# Patient Record
Sex: Male | Born: 1980 | Race: Black or African American | Hispanic: No | Marital: Single | Smoking: Current every day smoker
Health system: Southern US, Community
[De-identification: ages and names within clinical notes are randomized; demographics above are authoritative.]

## PROBLEM LIST (undated history)

## (undated) DIAGNOSIS — I10 Essential (primary) hypertension: Secondary | ICD-10-CM

## (undated) DIAGNOSIS — I82409 Acute embolism and thrombosis of unspecified deep veins of unspecified lower extremity: Secondary | ICD-10-CM

---

## 2004-10-21 ENCOUNTER — Emergency Department: Payer: Self-pay | Admitting: Emergency Medicine

## 2004-11-09 ENCOUNTER — Emergency Department: Payer: Self-pay | Admitting: Emergency Medicine

## 2005-02-18 ENCOUNTER — Emergency Department: Payer: Self-pay | Admitting: General Practice

## 2006-02-01 ENCOUNTER — Emergency Department: Payer: Self-pay | Admitting: Emergency Medicine

## 2006-08-25 ENCOUNTER — Emergency Department: Payer: Self-pay | Admitting: Emergency Medicine

## 2006-09-04 ENCOUNTER — Emergency Department: Payer: Self-pay | Admitting: Emergency Medicine

## 2006-09-27 ENCOUNTER — Emergency Department: Payer: Self-pay | Admitting: Emergency Medicine

## 2007-07-14 ENCOUNTER — Emergency Department: Payer: Self-pay | Admitting: Emergency Medicine

## 2008-03-02 ENCOUNTER — Emergency Department: Payer: Self-pay | Admitting: Emergency Medicine

## 2008-03-02 ENCOUNTER — Other Ambulatory Visit: Payer: Self-pay

## 2008-08-14 IMAGING — CR DG HAND COMPLETE 3+V*L*
1 series · 3 of 3 positions shown · non-contrast
Comparison: none

REASON FOR EXAM: lacertion - c/o pain and numbness -  rm5
COMMENTS:   LMP: (Male)

PROCEDURE:     DXR - DXR HAND LT COMPLETE  W/OBLIQUES  - July 14, 2007  [DATE]
RESULT:     Comparison: No available comparison exam.

[Series 1: view not recorded · 0.17mm/px · 3 of 3 slices shown]
[im 1/3]
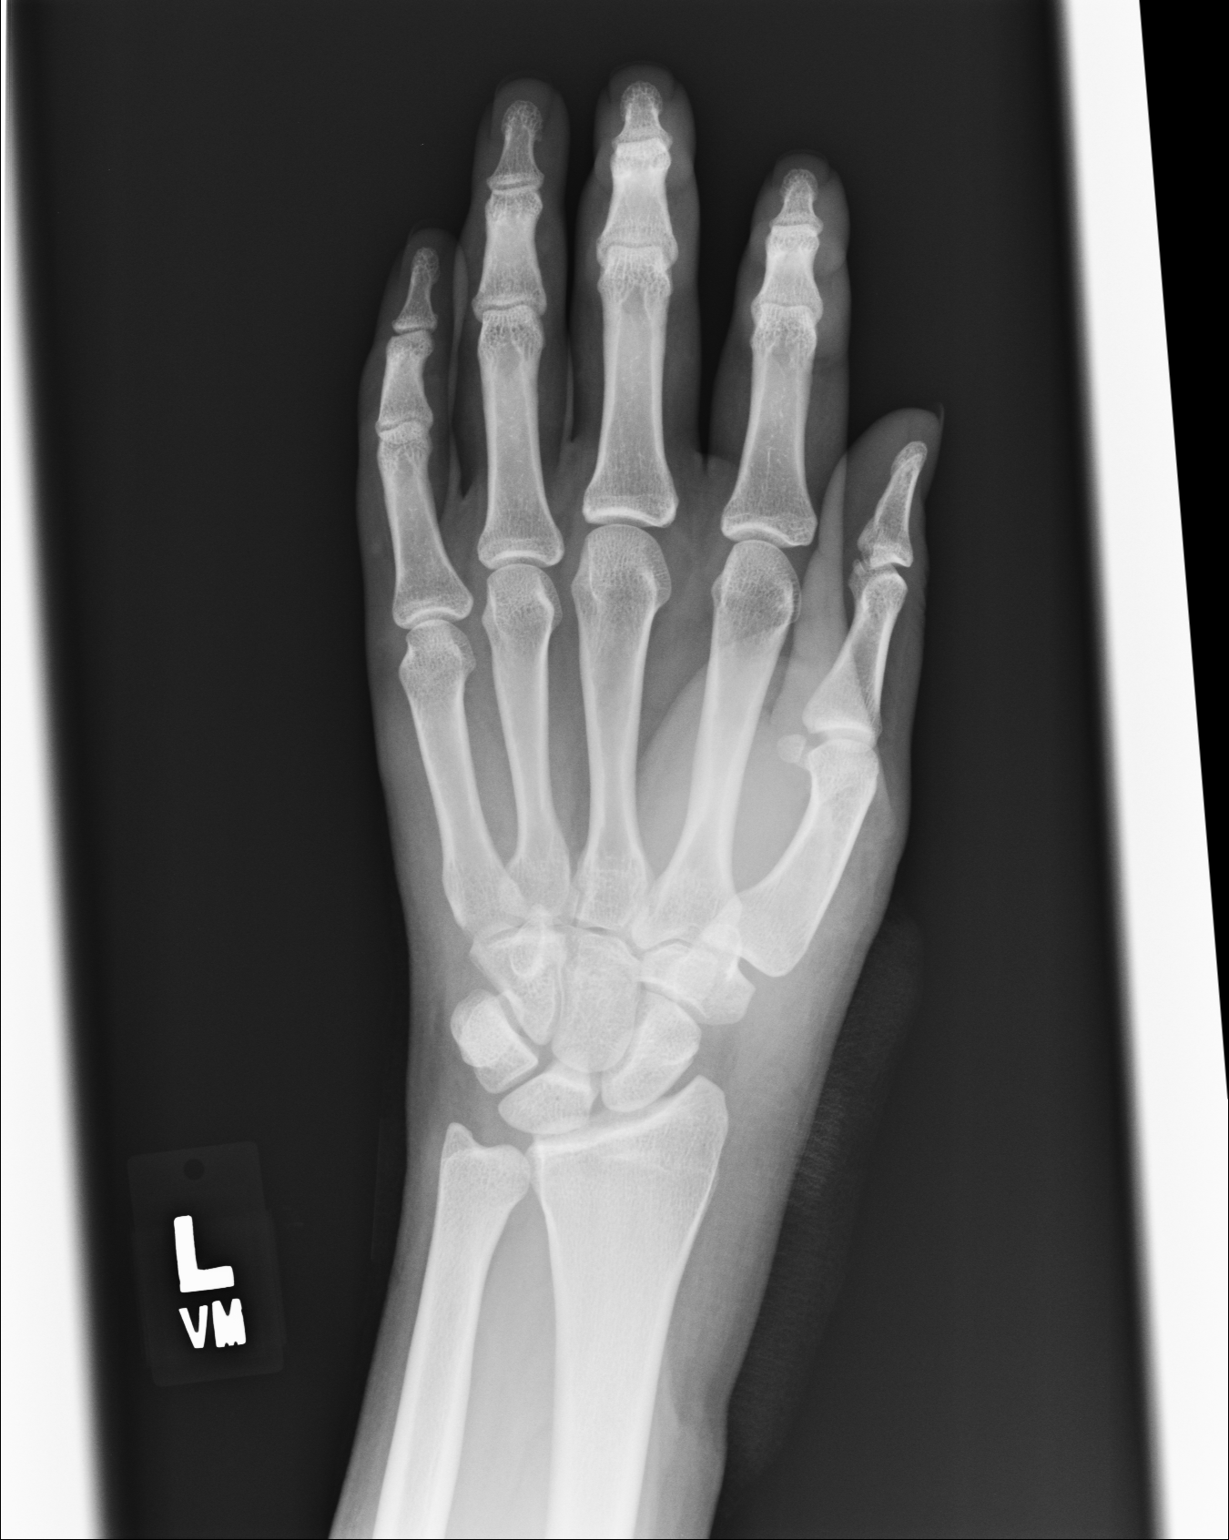
[im 2/3]
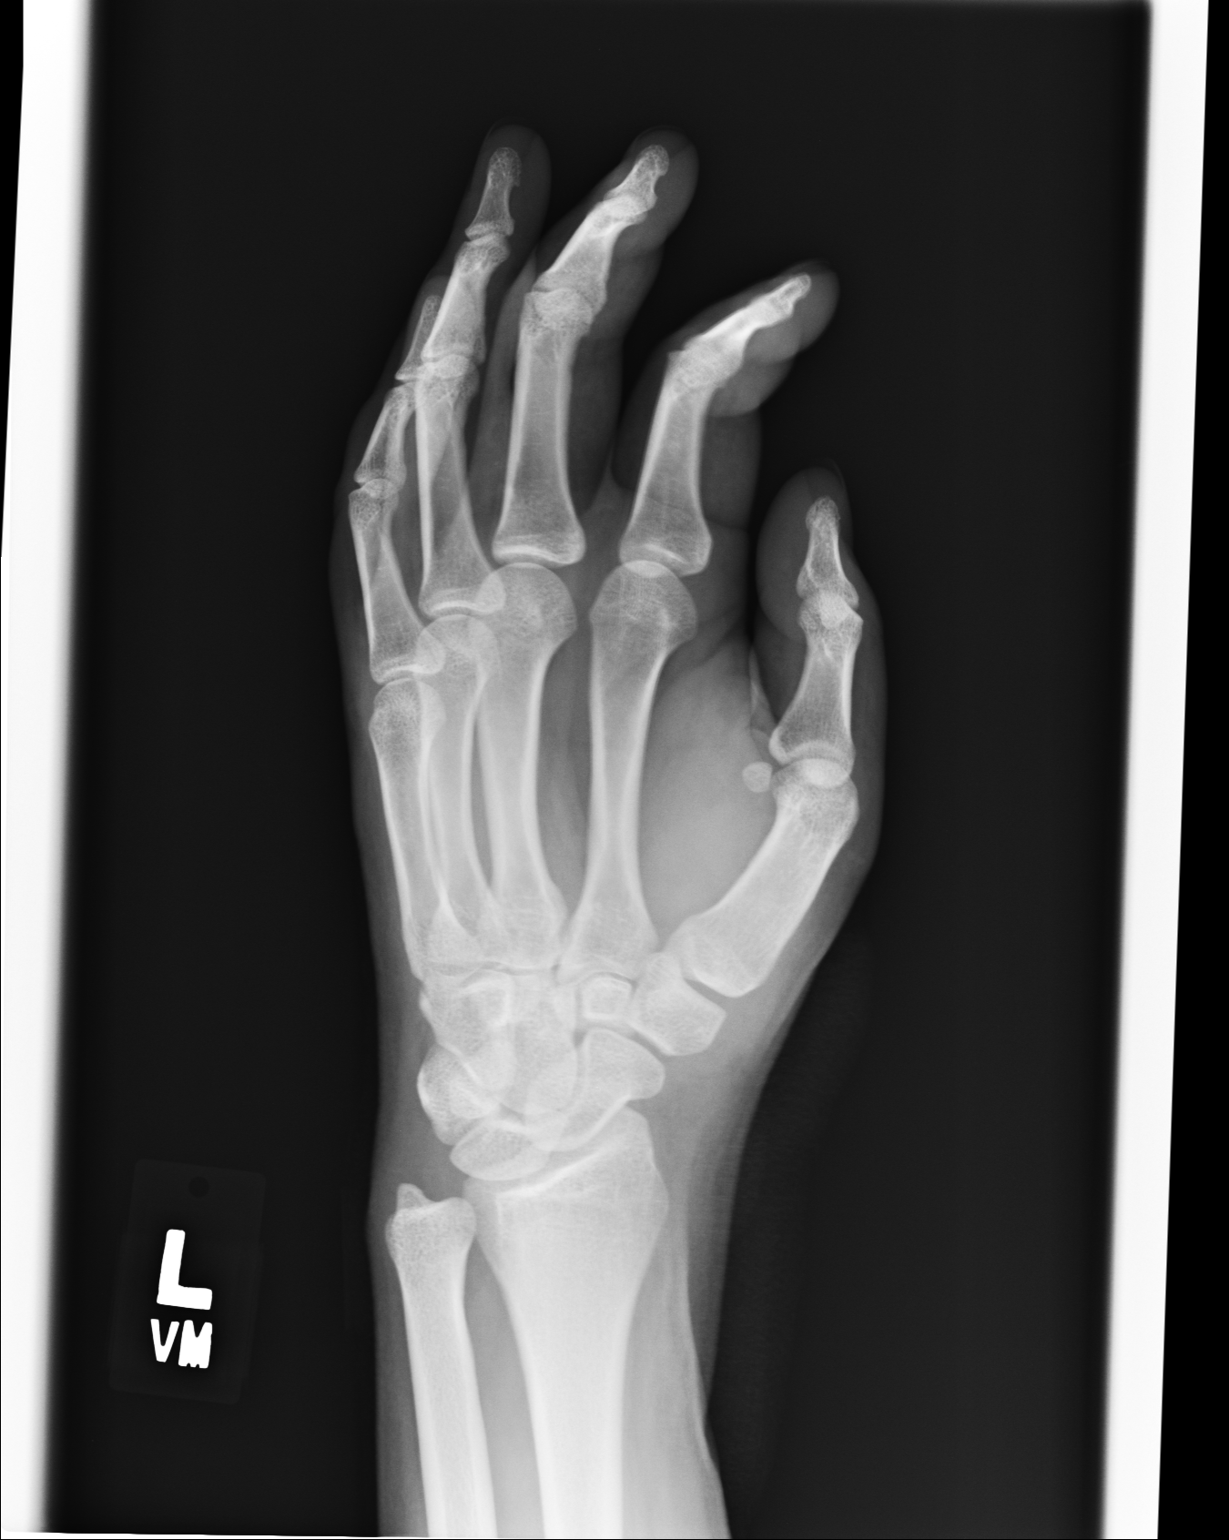
[im 3/3]
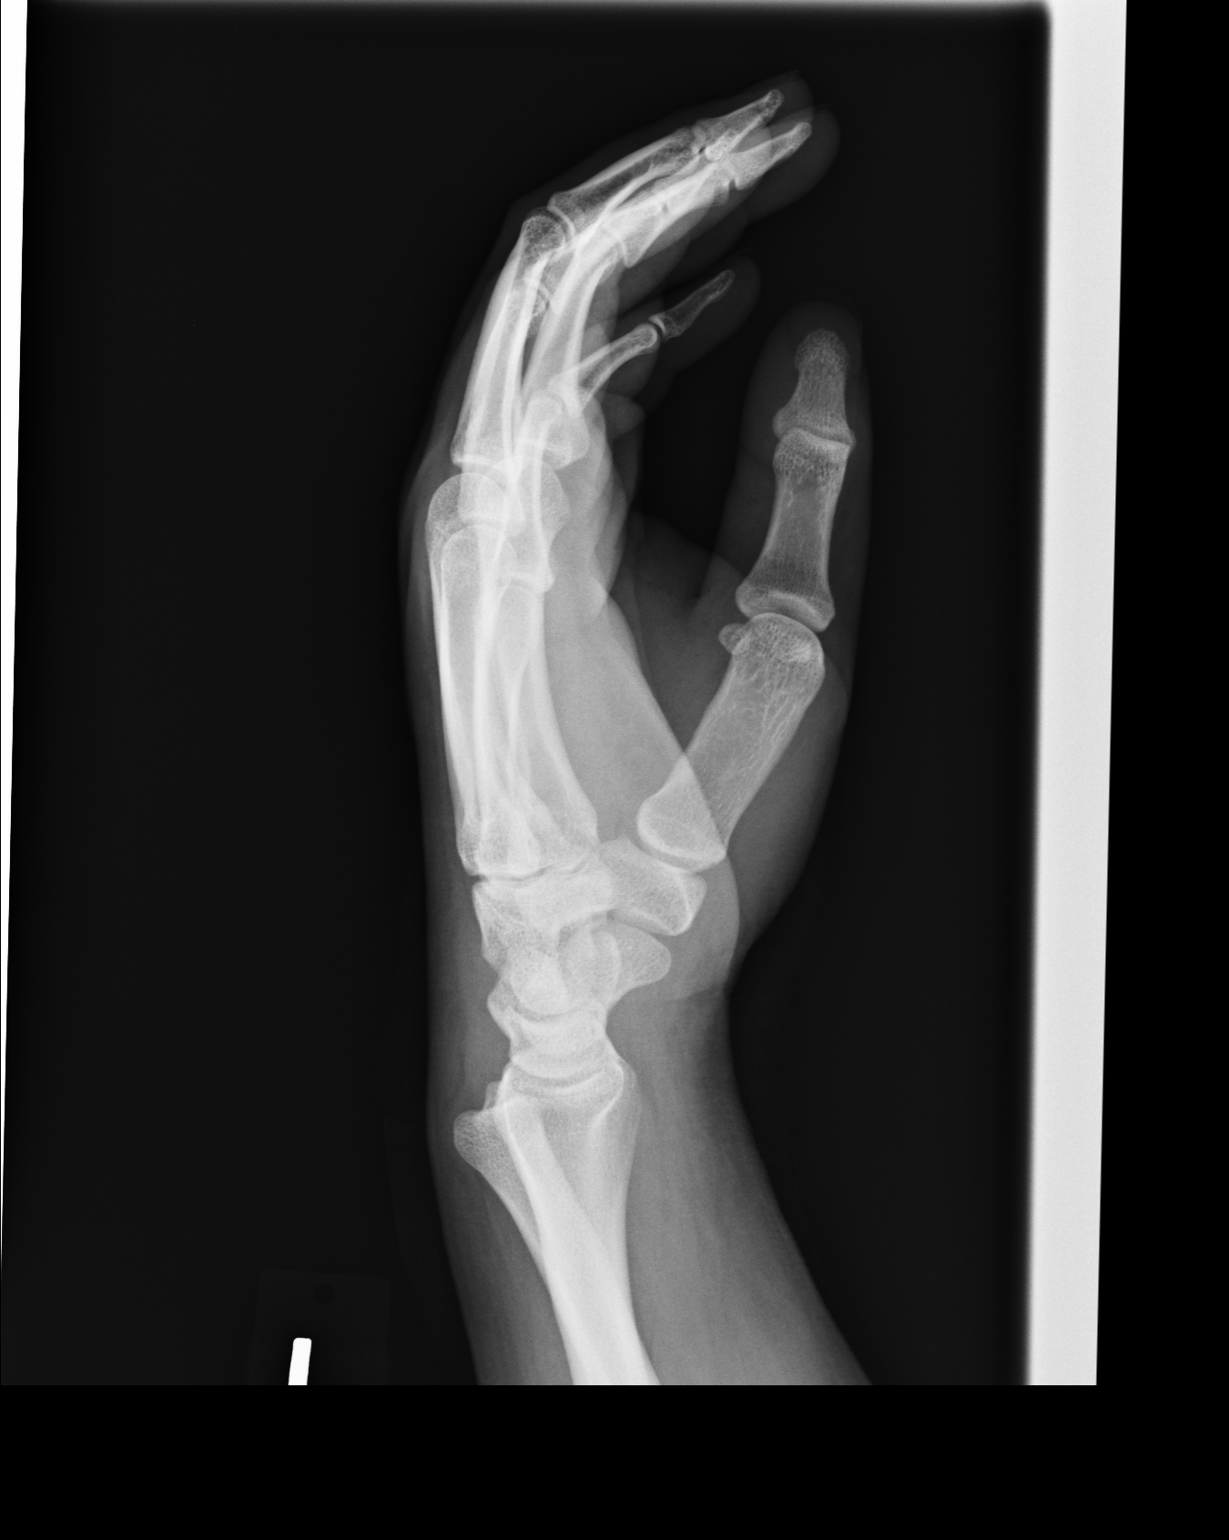

[3 of 3 positions shown; findings below may reference images not displayed]

FINDINGS: Three views of the left hand were obtained.

No fracture or dislocation of the left hand is noted. No significant joint
abnormality is seen.
IMPRESSION: 1. No fracture or dislocation of the left hand is noted.

## 2010-06-10 ENCOUNTER — Emergency Department: Payer: Self-pay | Admitting: Unknown Physician Specialty

## 2010-10-28 ENCOUNTER — Emergency Department: Payer: Self-pay | Admitting: Emergency Medicine

## 2010-12-24 ENCOUNTER — Emergency Department: Payer: Self-pay | Admitting: Emergency Medicine

## 2013-04-16 ENCOUNTER — Emergency Department: Payer: Self-pay | Admitting: Emergency Medicine

## 2013-04-16 LAB — CBC
HCT: 45.1 % (ref 40.0–52.0)
HGB: 14.8 g/dL (ref 13.0–18.0)
RBC: 5.17 10*6/uL (ref 4.40–5.90)
RDW: 14.1 % (ref 11.5–14.5)
WBC: 11 10*3/uL — ABNORMAL HIGH (ref 3.8–10.6)

## 2013-04-16 LAB — BASIC METABOLIC PANEL
Calcium, Total: 9.4 mg/dL (ref 8.5–10.1)
Creatinine: 0.68 mg/dL (ref 0.60–1.30)
EGFR (African American): >60
EGFR (Non-African Amer.): >60
Osmolality: 275 (ref 275–301)
Potassium: 4 mmol/L (ref 3.5–5.1)
Sodium: 139 mmol/L (ref 136–145)

## 2013-04-16 LAB — URINALYSIS, COMPLETE
Bilirubin,UR: NEGATIVE
Blood: NEGATIVE
Glucose,UR: NEGATIVE mg/dL (ref 0–75)
Specific Gravity: 1.016 (ref 1.003–1.030)
Squamous Epithelial: 4

## 2013-04-16 LAB — LIPASE, BLOOD: Lipase: 130 U/L (ref 73–393)

## 2014-04-29 ENCOUNTER — Emergency Department: Payer: Self-pay | Admitting: Emergency Medicine

## 2014-05-20 ENCOUNTER — Emergency Department: Payer: Self-pay | Admitting: Emergency Medicine

## 2015-02-02 ENCOUNTER — Emergency Department: Payer: Self-pay | Admitting: Emergency Medicine

## 2015-06-21 IMAGING — CR LEFT WRIST - COMPLETE 3+ VIEW
1 series · 4 of 4 positions shown · non-contrast
Comparison: None.

CLINICAL DATA: No known injury.  Wrist pain.

EXAM:
LEFT WRIST - COMPLETE 3+ VIEW

[Series 1: pa · 0.17mm/px · 4 of 4 slices shown]
[im 1/4]
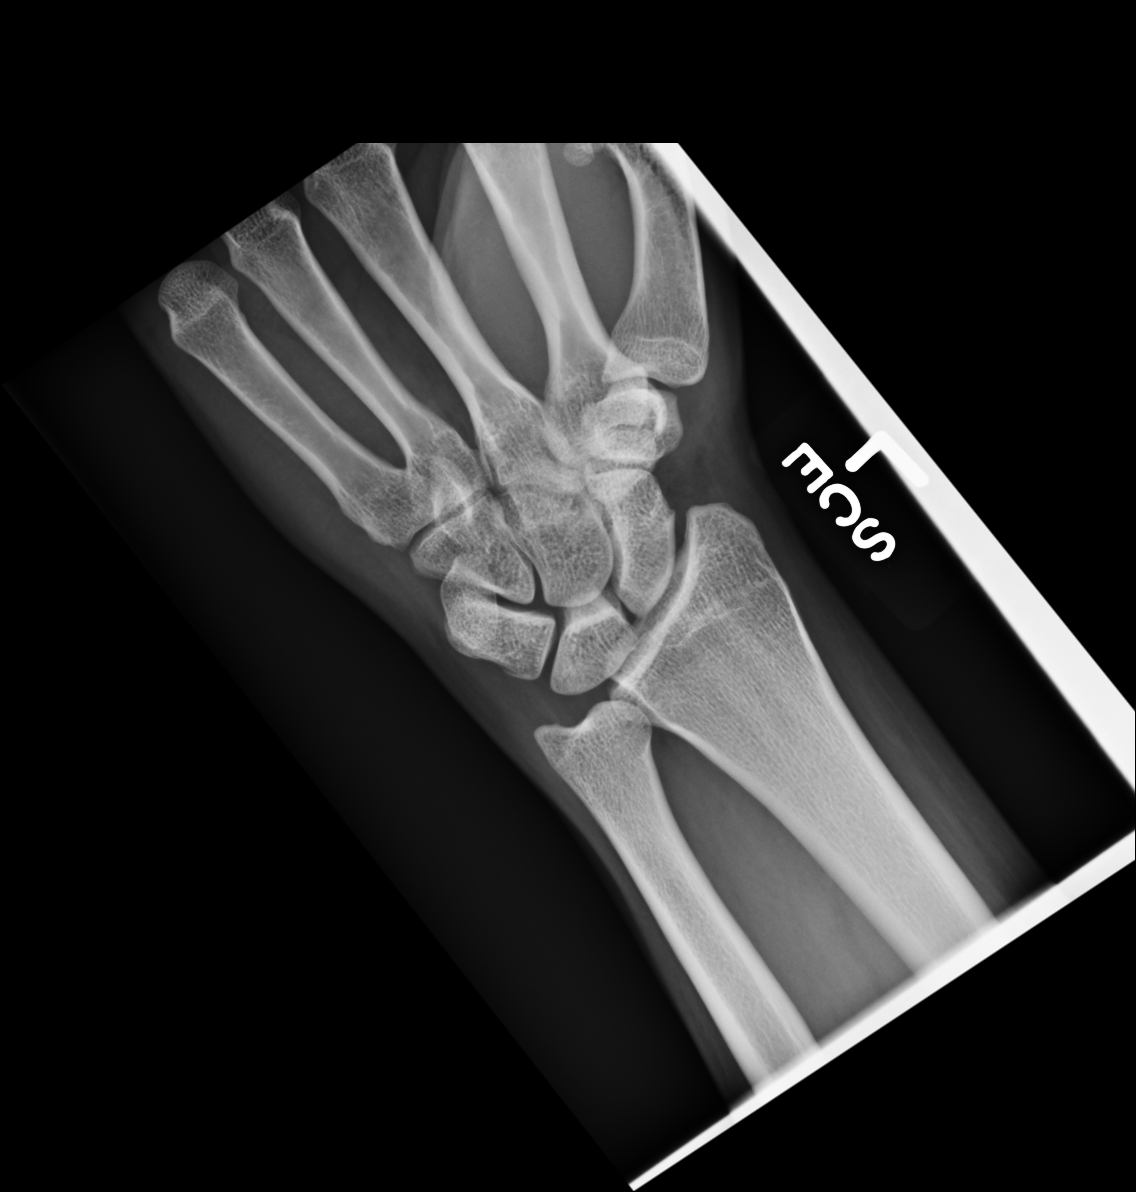
[im 2/4]
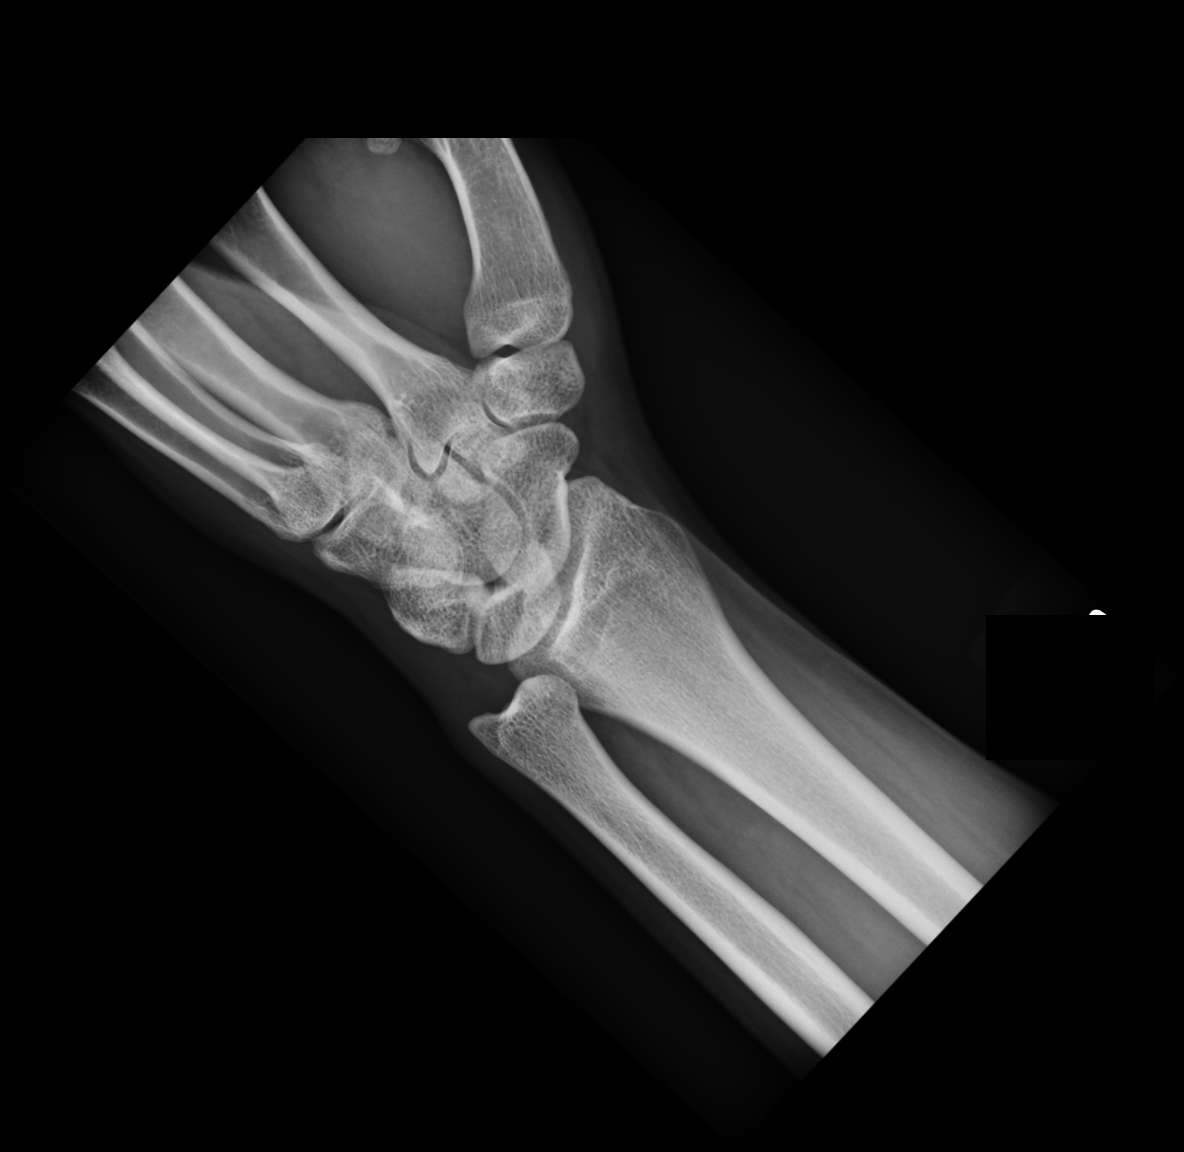
[im 3/4]
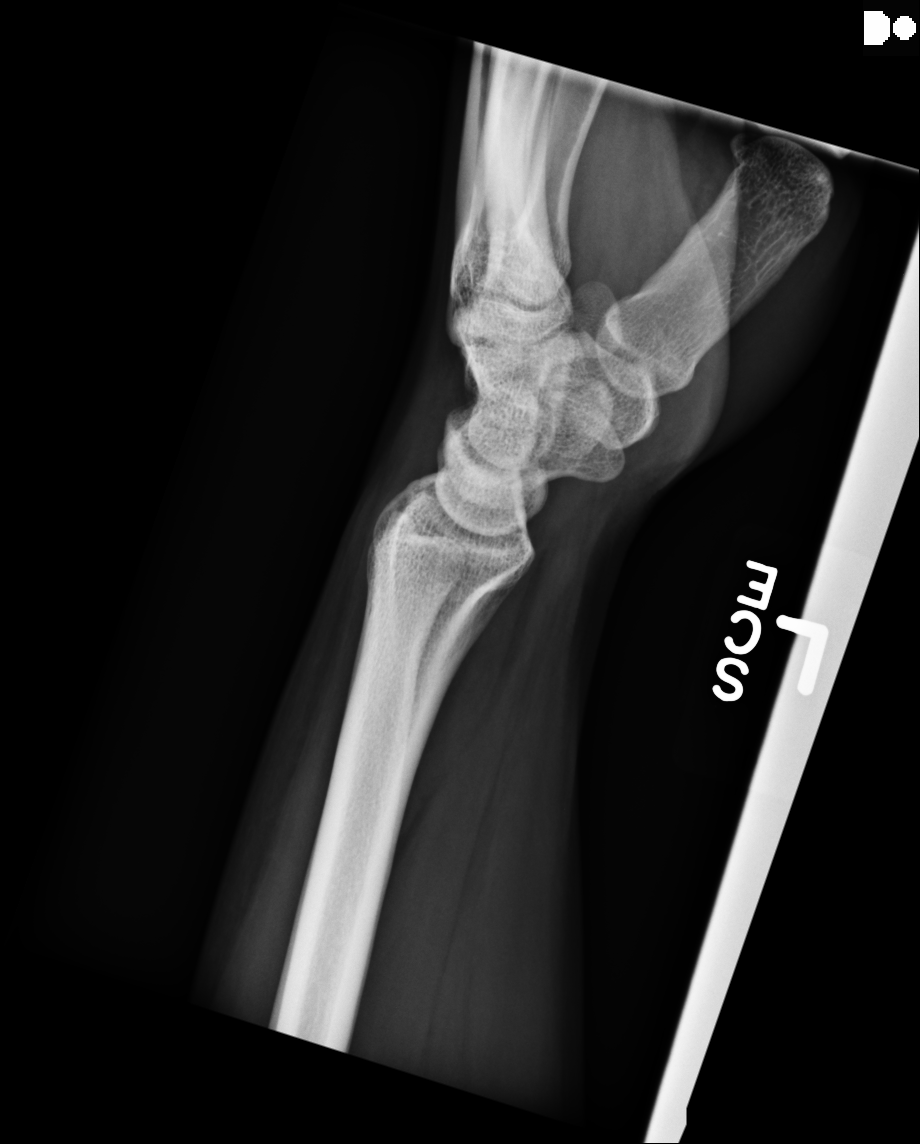
[im 4/4]
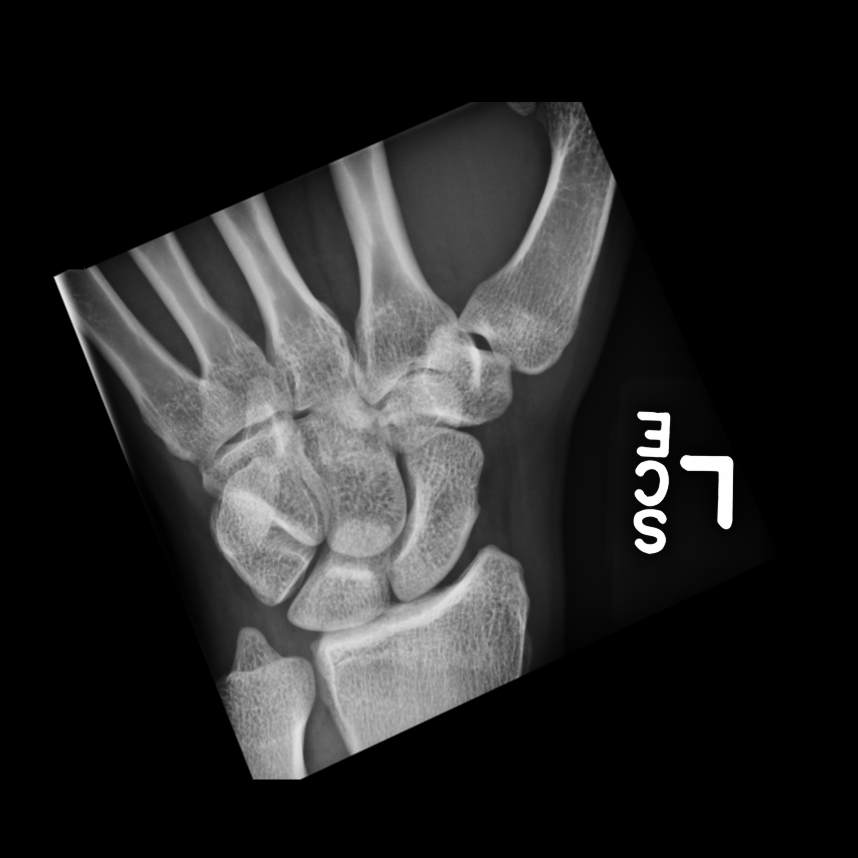

[4 of 4 positions shown; findings below may reference images not displayed]

FINDINGS: There is no evidence of fracture or dislocation. There is no
evidence of arthropathy or other focal bone abnormality. Soft
tissues are unremarkable.
IMPRESSION: Negative.

## 2016-02-21 ENCOUNTER — Emergency Department
Admission: EM | Admit: 2016-02-21 | Discharge: 2016-02-21 | Disposition: A | Discharge status: Home / Self Care | Payer: Self-pay | Attending: Student | Admitting: Student

## 2016-02-21 ENCOUNTER — Encounter: Payer: Self-pay | Admitting: Emergency Medicine

## 2016-02-21 DIAGNOSIS — Y9289 Other specified places as the place of occurrence of the external cause: Secondary | ICD-10-CM | POA: Insufficient documentation

## 2016-02-21 DIAGNOSIS — S61412A Laceration without foreign body of left hand, initial encounter: Secondary | ICD-10-CM | POA: Insufficient documentation

## 2016-02-21 DIAGNOSIS — Y998 Other external cause status: Secondary | ICD-10-CM | POA: Insufficient documentation

## 2016-02-21 DIAGNOSIS — IMO0002 Reserved for concepts with insufficient information to code with codable children: Secondary | ICD-10-CM

## 2016-02-21 DIAGNOSIS — Y288XXA Contact with other sharp object, undetermined intent, initial encounter: Secondary | ICD-10-CM | POA: Insufficient documentation

## 2016-02-21 DIAGNOSIS — Y9389 Activity, other specified: Secondary | ICD-10-CM | POA: Insufficient documentation

## 2016-02-21 MED ORDER — LIDOCAINE HCL (PF) 1 % IJ SOLN
5.0000 mL | Freq: Once | INTRAMUSCULAR | Status: DC
  Filled 2016-02-21: qty 5

## 2016-02-21 NOTE — ED Notes (Signed)
Reports cut left pinky with can lid.  Bleeding controlled.

## 2016-02-21 NOTE — Discharge Instructions (Signed)
Laceration Care, Adult  A laceration is a cut that goes through all layers of the skin. The cut also goes into the tissue that is right under the skin. Some cuts heal on their own. Others need to be closed with stitches (sutures), staples, skin adhesive strips, or wound glue. Taking care of your cut lowers your risk of infection and helps your cut to heal better.  HOW TO TAKE CARE OF YOUR CUT  For stitches or staples:  · Keep the wound clean and dry.  · If you were given a bandage (dressing), you should change it at least one time per day or as told by your doctor. You should also change it if it gets wet or dirty.  · Keep the wound completely dry for the first 24 hours or as told by your doctor. After that time, you may take a shower or a bath. However, make sure that the wound is not soaked in water until after the stitches or staples have been removed.  · Clean the wound one time each day or as told by your doctor:    Wash the wound with soap and water.    Rinse the wound with water until all of the soap comes off.    Pat the wound dry with a clean towel. Do not rub the wound.  · After you clean the wound, put a thin layer of antibiotic ointment on it as told by your doctor. This ointment:    Helps to prevent infection.    Keeps the bandage from sticking to the wound.  · Have your stitches or staples removed as told by your doctor.  If your doctor used skin adhesive strips:   · Keep the wound clean and dry.  · If you were given a bandage, you should change it at least one time per day or as told by your doctor. You should also change it if it gets dirty or wet.  · Do not get the skin adhesive strips wet. You can take a shower or a bath, but be careful to keep the wound dry.  · If the wound gets wet, pat it dry with a clean towel. Do not rub the wound.  · Skin adhesive strips fall off on their own. You can trim the strips as the wound heals. Do not remove any strips that are still stuck to the wound. They will  fall off after a while.  If your doctor used wound glue:  · Try to keep your wound dry, but you may briefly wet it in the shower or bath. Do not soak the wound in water, such as by swimming.  · After you take a shower or a bath, gently pat the wound dry with a clean towel. Do not rub the wound.  · Do not do any activities that will make you really sweaty until the skin glue has fallen off on its own.  · Do not apply liquid, cream, or ointment medicine to your wound while the skin glue is still on.  · If you were given a bandage, you should change it at least one time per day or as told by your doctor. You should also change it if it gets dirty or wet.  · If a bandage is placed over the wound, do not let the tape for the bandage touch the skin glue.  · Do not pick at the glue. The skin glue usually stays on for 5-10 days. Then, it   or when wound glue stays in place and the wound is healed. Make sure to wear a sunscreen of at least 30 SPF.  Take over-the-counter and prescription medicines only as told by your doctor.  If you were given antibiotic medicine or ointment, take or apply it as told by your doctor. Do not stop using the antibiotic even if your wound is getting better.  Do not scratch or pick at the wound.  Keep all follow-up visits as told by your doctor. This is important.  Check your wound every day for signs of infection. Watch for:  Redness, swelling, or pain.  Fluid, blood, or pus.  Raise (elevate) the injured area above the level of your heart while you are sitting or lying down, if possible. GET HELP IF:  You got a tetanus shot and you have any of these problems at the injection site:  Swelling.  Very bad pain.  Redness.  Bleeding.  You have a fever.  A wound that was  closed breaks open.  You notice a bad smell coming from your wound or your bandage.  You notice something coming out of the wound, such as wood or glass.  Medicine does not help your pain.  You have more redness, swelling, or pain at the site of your wound.  You have fluid, blood, or pus coming from your wound.  You notice a change in the color of your skin near your wound.  You need to change the bandage often because fluid, blood, or pus is coming from the wound.  You start to have a new rash.  You start to have numbness around the wound. GET HELP RIGHT AWAY IF:  You have very bad swelling around the wound.  Your pain suddenly gets worse and is very bad.  You notice painful lumps near the wound or on skin that is anywhere on your body.  You have a red streak going away from your wound.  The wound is on your hand or foot and you cannot move a finger or toe like you usually can.  The wound is on your hand or foot and you notice that your fingers or toes look pale or bluish.   This information is not intended to replace advice given to you by your health care provider. Make sure you discuss any questions you have with your health care provider.   Document Released: 05/29/2008 Document Revised: 04/27/2015 Document Reviewed: 12/07/2014 Elsevier Interactive Patient Education [employer]   Keep the wound clean, dry, and covered. Return to [facility] in 10 days for suture removal.

## 2016-02-21 NOTE — ED Notes (Signed)
States he cut his left hand with a can.. Laceration noted to web space between 4th and 5 th fingers

## 2016-02-22 NOTE — ED Provider Notes (Signed)
[facility] Emergency Department Provider Note ____________________________________________  Time seen: 1520  I have reviewed the triage vital signs and the nursing notes.  HISTORY  Chief Complaint  Laceration  HPI Glenn Horn is a 35 y.o. male visits to the ED for evaluation of a laceration sustained to the fourth interspace of the left hand. He describes cutting his hand accidentally with a can. Bleeding is currently controlled.Patient reports a current tetanus status. He rates his pain at a 5/10 in triage.  History reviewed. No pertinent past medical history.  There are no active problems to display for this patient.  History reviewed. No pertinent past surgical history.  No current outpatient prescriptions on file.  Allergies Codeine  History reviewed. No pertinent family history.  Social History Social History  Substance Use Topics  . Smoking status: Never Smoker   . Smokeless tobacco: None  . Alcohol Use: None   Review of Systems  Constitutional: Negative for fever. Eyes: Negative for visual changes. ENT: Negative for sore throat. Cardiovascular: Negative for chest pain. Respiratory: Negative for shortness of breath. Musculoskeletal: Negative for back pain. Skin: Negative for rash. Hand laceration as above Neurological: Negative for headaches, focal weakness or numbness. ____________________________________________  PHYSICAL EXAM:  VITAL SIGNS: ED Triage Vitals  Enc Vitals Group     BP 02/21/16 1501 153/99 mmHg     Pulse Rate 02/21/16 1501 67     Resp 02/21/16 1501 18     Temp 02/21/16 1501 98.6 F (37 C)     Temp Source 02/21/16 1501 Oral     SpO2 02/21/16 1501 100 %     Weight 02/21/16 1501 210 lb (95.255 kg)     Height 02/21/16 1501  (1.803 m)     Head Cir --      Peak Flow --      Pain Score 02/21/16 1458 5     Pain Loc --      Pain Edu? --      Excl. in GC? --    Constitutional: Alert and oriented. Well  appearing and in no distress. Head: Normocephalic and atraumatic. Eyes: Conjunctivae are normal. PERRL. Normal extraocular movements Hematological/Lymphatic/Immunological: No cervical lymphadenopathy. Cardiovascular: Normal rate, regular rhythm. Normal distal pulses Respiratory: Normal respiratory effort.  Musculoskeletal: Normal composite fist. Nontender with normal range of motion in all extremities.  Neurologic: Normal gross sensation. Normal intrinsic/oppositition testing. CN II-XII grossly intact. Normal gait without ataxia. Normal speech and language. No gross focal neurologic deficits are appreciated. Skin:  Skin is warm, dry and intact. No rash noted. Lac to the 4th web space on the left hand. Psychiatric: Mood and affect are normal. Patient exhibits appropriate insight and judgment. ___________________________________________  PROCEDURES  LACERATION REPAIR Performed by: Lissa Hoard Authorized by: Lissa Hoard Consent: Verbal consent obtained. Risks and benefits: risks, benefits and alternatives were discussed Consent given by: patient Patient identity confirmed: provided demographic data Prepped and Draped in normal sterile fashion Wound explored  Laceration Location: left hand   Laceration Length: 2 cm  No Foreign Bodies seen or palpated  Anesthesia: local infiltration  Local anesthetic: lidocaine 1% w/o epinephrine  Anesthetic total: 3 ml  Irrigation method: syringe Amount of cleaning: standard  Skin closure: 5-0 nylon  Number of sutures: 5  Technique: interrupted  Patient tolerance: Patient tolerated the procedure well with no immediate complications. ____________________________________________  INITIAL IMPRESSION / ASSESSMENT AND PLAN / ED COURSE  Patient with a laceration to the left  hand fourth interspace status post suture repair. He is given instruction on wound care management and advised to return to local urgent care  provider in 7-10 days for suture removal. ____________________________________________  FINAL CLINICAL IMPRESSION(S) / ED DIAGNOSES  Final diagnoses:  Laceration  Hand laceration, left, initial encounter      Lissa Hoard, PA-C 02/22/16 0107  Gayla Doss, MD 02/26/16 [phone]

## 2016-03-31 ENCOUNTER — Encounter: Payer: Self-pay | Admitting: Emergency Medicine

## 2016-03-31 ENCOUNTER — Emergency Department
Admission: EM | Admit: 2016-03-31 | Discharge: 2016-03-31 | Disposition: A | Discharge status: Home / Self Care | Payer: BLUE CROSS/BLUE SHIELD | Attending: Emergency Medicine | Admitting: Emergency Medicine

## 2016-03-31 DIAGNOSIS — Z4802 Encounter for removal of sutures: Secondary | ICD-10-CM | POA: Diagnosis not present

## 2016-03-31 NOTE — ED Provider Notes (Signed)
[facility] Emergency Department Provider Note  ____________________________________________  Time seen: Approximately 1:50 PM  I have reviewed the triage vital signs and the nursing notes.   HISTORY  Chief Complaint Suture / Staple Removal    HPI Glenn Horn is a 35 y.o. male presents for suture removal. Patient states he has to sit for 1 week. Has 4 stitches and denies any complaints at this time.   History reviewed. No pertinent past medical history.  There are no active problems to display for this patient.   History reviewed. No pertinent past surgical history.  No current outpatient prescriptions on file.  Allergies Codeine  No family history on file.  Social History Social History  Substance Use Topics  . Smoking status: Never Smoker   . Smokeless tobacco: None  . Alcohol Use: None    Review of Systems Constitutional: No fever/chills Skin: Positive for 4 stitches intact left hand. Neurological: Negative for headaches, focal weakness or numbness.  10-point ROS otherwise negative.  ____________________________________________   PHYSICAL EXAM:  VITAL SIGNS: ED Triage Vitals  Enc Vitals Group     BP 03/31/16 1328 167/100 mmHg     Pulse Rate 03/31/16 1328 83     Resp 03/31/16 1328 16     Temp 03/31/16 1328 98.1 F (36.7 C)     Temp Source 03/31/16 1328 Oral     SpO2 03/31/16 1328 99 %     Weight 03/31/16 1328 207 lb (93.895 kg)     Height 03/31/16 1328 5\' 11"  (1.803 m)     Head Cir --      Peak Flow --      Pain Score 03/31/16 1329 0     Pain Loc --      Pain Edu? --      Excl. in GC? --     Constitutional: Alert and oriented. Well appearing and in no acute distress. Skin:  Left hand sutures intact no evidence of erythema edema or drainage. Well healed laceration edges approximated Psychiatric: Mood and affect are normal. Speech and behavior are normal.  ____________________________________________    LABS (all labs ordered are listed, but only abnormal results are displayed)  Labs Reviewed - No data to display   PROCEDURES  Procedure(s) performed: None  Critical Care performed: No  ____________________________________________   INITIAL IMPRESSION / ASSESSMENT AND PLAN / ED COURSE  Pertinent labs & imaging results that were available during my care of the patient were reviewed by me and considered in my medical decision making (see chart for details).  Suture removal. Patient voices no other emergency medical complaints at this time. ____________________________________________   FINAL CLINICAL IMPRESSION(S) / ED DIAGNOSES  Final diagnoses:  Visit for suture removal     This chart was dictated using voice recognition software/Dragon. Despite best efforts to proofread, errors can occur which can change the meaning. Any change was purely unintentional.   Evangeline Dakinharles M Beers, PA-C 03/31/16 1358  Myrna Blazeravid Matthew Schaevitz, MD 03/31/16 [phone]

## 2016-03-31 NOTE — ED Notes (Signed)
Needs sutures removed out of left hand.  States they should have been out over a week ago.  No obvious signs of infection noted.

## 2016-03-31 NOTE — Discharge Instructions (Signed)

## 2016-03-31 NOTE — ED Notes (Signed)
Pt states that he was supposed to have his sutures removed a week ago, pt has 4 remaining stiches in the rt hand

## 2016-10-11 ENCOUNTER — Encounter: Payer: Self-pay | Admitting: *Deleted

## 2016-10-11 ENCOUNTER — Emergency Department
Admission: EM | Admit: 2016-10-11 | Discharge: 2016-10-11 | Disposition: A | Discharge status: Home / Self Care | Payer: BLUE CROSS/BLUE SHIELD | Attending: Emergency Medicine | Admitting: Emergency Medicine

## 2016-10-11 DIAGNOSIS — G5622 Lesion of ulnar nerve, left upper limb: Secondary | ICD-10-CM

## 2016-10-11 DIAGNOSIS — M79642 Pain in left hand: Secondary | ICD-10-CM | POA: Diagnosis present

## 2016-10-11 MED ORDER — MELOXICAM 15 MG PO TABS: 15.0000 mg | ORAL_TABLET | Freq: Every day | ORAL | 0 refills | Status: DC

## 2016-10-11 NOTE — ED Provider Notes (Signed)
[facility]-EMERGENCY DEPARTMENT Provider Note   CSN: [id number] Arrival date & time: 10/11/16  1505     History   Chief Complaint Chief Complaint  Patient presents with  . Hand Pain    HPI Glenn Horn is a 35 y.o. male presents to the emergency department for evaluation of left hand numbness. Patient has numbness, tingling into the fourth and fifth digits of the left hand, symptoms have been present for 3 weeks after falling asleep on his left elbow. He denies any trauma or injury. No weakness with grip strength. No numbness in the thumb index or middle finger. Symptoms have been constant, increased with placing his elbow on hard surfaces and elbow range of motion. He denies any neck pain or weakness in the upper extremity. He is not taking any medications for pain. He describes the pain as numbness and burning, symptoms are moderate.  HPI  History reviewed. No pertinent past medical history.  There are no active problems to display for this patient.   History reviewed. No pertinent surgical history.     Home Medications    Prior to Admission medications   Medication Sig Start Date End Date Taking? Authorizing Provider  meloxicam (MOBIC) 15 MG tablet Take 1 tablet (15 mg total) by mouth daily. 10/11/16   Evon Slackhomas C Erika Slaby, PA-C    Family History History reviewed. No pertinent family history.  Social History Social History  Substance Use Topics  . Smoking status: Never Smoker  . Smokeless tobacco: Not on file  . Alcohol use Not on file     Allergies   Codeine   Review of Systems Review of Systems  Constitutional: Negative.  Negative for activity change, appetite change, chills and fever.  HENT: Negative for congestion, ear pain, mouth sores, rhinorrhea, sinus pressure, sore throat and trouble swallowing.   Eyes: Negative for photophobia, pain and discharge.  Respiratory: Negative for cough, chest tightness and shortness of breath.   Cardiovascular: Negative for  chest pain and leg swelling.  Gastrointestinal: Negative for abdominal distention, abdominal pain, diarrhea, nausea and vomiting.  Genitourinary: Negative for difficulty urinating and dysuria.  Musculoskeletal: Negative for arthralgias, back pain and gait problem.  Skin: Negative for color change and rash.  Neurological: Positive for numbness (left hand 4th 5th digit). Negative for dizziness, weakness and headaches.  Hematological: Negative for adenopathy.  Psychiatric/Behavioral: Negative for agitation and behavioral problems.     Physical Exam Updated Vital Signs BP (!) 169/108 (BP Location: Left Arm)   Pulse 78   Temp 98.4 F (36.9 C) (Oral)   Resp 18   Ht 6' (1.829 m)   Wt 93 kg   SpO2 100%   BMI 27.80 kg/m   Physical Exam  Constitutional: He appears well-developed and well-nourished.  HENT:  Head: Normocephalic and atraumatic.  Eyes: Conjunctivae are normal.  Neck: Normal range of motion. Neck supple.  Negative Spurling's test  Cardiovascular: Normal rate and regular rhythm.   No murmur heard. Pulmonary/Chest: Effort normal and breath sounds normal. No respiratory distress.  Abdominal: Soft. There is no tenderness.  Musculoskeletal: Normal range of motion. He exhibits no edema.  Examination of the left upper extremity shows patient has full range of motion of the shoulder or elbow wrist and digits. Positive Tinel's sign to the left elbow. Negative Tinel's and Phalen's sign to the wrist. Grip strength 5 out of 5. 2+ radial pulse, 2+ cap refill  Neurological: He is alert.  Skin: Skin is warm and dry.  Psychiatric: He  has a normal mood and affect.  Nursing note and vitals reviewed.    ED Treatments / Results  Labs (all labs ordered are listed, but only abnormal results are displayed) Labs Reviewed - No data to display  EKG  EKG Interpretation None       Radiology No results found.  Procedures Procedures (including critical care time)  Medications  Ordered in ED Medications - No data to display   Initial Impression / Assessment and Plan / ED Course  I have reviewed the triage vital signs and the nursing notes.  Pertinent labs & imaging results that were available during my care of the patient were reviewed by me and considered in my medical decision making (see chart for details).  Clinical Course    35 year old male with left cubital tunnel syndrome. Negative exam findings for carpal tunnel syndrome. No signs of cervical radiculopathy. He is educated on taking anti-inflammatory medications for 10 days and follow-up with orthopedics if no improvement. He will avoid placing his elbow on hard surfaces and wear a towel around his elbow at nighttime to prevent compression with flexion.  Final Clinical Impressions(s) / ED Diagnoses   Final diagnoses:  Cubital tunnel syndrome on left    New Prescriptions New Prescriptions   MELOXICAM (MOBIC) 15 MG TABLET    Take 1 tablet (15 mg total) by mouth daily.     Evon Slack, PA-C 10/11/16 1534    Nita Sickle, MD 10/11/16 1911

## 2016-10-11 NOTE — ED Notes (Signed)
Pt alert and oriented X4, active, cooperative, pt in NAD. RR even and unlabored, color WNL.  Pt informed to return if any life threatening symptoms occur.   

## 2016-10-11 NOTE — ED Triage Notes (Signed)
States numbness in his left hand, 3 fingers, states numbness for 3 week and now states pain is in his wrist

## 2016-10-11 NOTE — Discharge Instructions (Signed)
Please take medications as prescribed. Wrap towel around the elbow at nighttime. Avoid placing your elbow on hard surfaces.

## 2016-12-04 ENCOUNTER — Emergency Department: Payer: BLUE CROSS/BLUE SHIELD

## 2016-12-04 ENCOUNTER — Emergency Department
Admission: EM | Admit: 2016-12-04 | Discharge: 2016-12-04 | Disposition: A | Discharge status: Home / Self Care | Payer: BLUE CROSS/BLUE SHIELD | Attending: Emergency Medicine | Admitting: Emergency Medicine

## 2016-12-04 ENCOUNTER — Encounter: Payer: Self-pay | Admitting: Emergency Medicine

## 2016-12-04 DIAGNOSIS — W208XXA Other cause of strike by thrown, projected or falling object, initial encounter: Secondary | ICD-10-CM | POA: Insufficient documentation

## 2016-12-04 DIAGNOSIS — Y929 Unspecified place or not applicable: Secondary | ICD-10-CM | POA: Diagnosis not present

## 2016-12-04 DIAGNOSIS — S9032XA Contusion of left foot, initial encounter: Secondary | ICD-10-CM | POA: Insufficient documentation

## 2016-12-04 DIAGNOSIS — Y939 Activity, unspecified: Secondary | ICD-10-CM | POA: Insufficient documentation

## 2016-12-04 DIAGNOSIS — S99922A Unspecified injury of left foot, initial encounter: Secondary | ICD-10-CM | POA: Diagnosis present

## 2016-12-04 DIAGNOSIS — Y999 Unspecified external cause status: Secondary | ICD-10-CM | POA: Insufficient documentation

## 2016-12-04 MED ORDER — TRAMADOL HCL 50 MG PO TABS: 50.0000 mg | ORAL_TABLET | Freq: Four times a day (QID) | ORAL | 0 refills | Status: DC | PRN

## 2016-12-04 MED ORDER — IBUPROFEN 600 MG PO TABS: 600.0000 mg | ORAL_TABLET | Freq: Four times a day (QID) | ORAL | 0 refills | Status: DC | PRN

## 2016-12-04 NOTE — ED Provider Notes (Signed)
[facility] Emergency Department Provider Note   ____________________________________________   None    (approximate)  I have reviewed the triage vital signs and the nursing notes.   HISTORY  Chief Complaint Foot Injury    HPI Glenn Horn is a 35 y.o. male patient complaining of left foot pain secondary to dropping a cutting board on his foot 2 days ago. Patient has a scabbed over small laceration to the dorsal aspect of the left foot. Patient stated pain increases with weightbearing.Patient rates the pain as a 7/10. Patient described a pain as "achy". No palliative measures taken for this complaint.   History reviewed. No pertinent past medical history.  There are no active problems to display for this patient.   History reviewed. No pertinent surgical history.  Prior to Admission medications   Medication Sig Start Date End Date Taking? Authorizing Provider  ibuprofen (ADVIL,MOTRIN) 600 MG tablet Take 1 tablet (600 mg total) by mouth every 6 (six) hours as needed. 12/04/16   Joni Reiningonald K Yahel Fuston, PA-C  meloxicam (MOBIC) 15 MG tablet Take 1 tablet (15 mg total) by mouth daily. 10/11/16   Evon Slackhomas C Gaines, PA-C  traMADol (ULTRAM) 50 MG tablet Take 1 tablet (50 mg total) by mouth every 6 (six) hours as needed for moderate pain. 12/04/16   Joni Reiningonald K Kristain Hu, PA-C    Allergies Codeine  No family history on file.  Social History Social History  Substance Use Topics  . Smoking status: Never Smoker  . Smokeless tobacco: Never Used  . Alcohol use No    Review of Systems Constitutional: No fever/chills Eyes: No visual changes. ENT: No sore throat. Cardiovascular: Denies chest pain. Respiratory: Denies shortness of breath. Gastrointestinal: No abdominal pain.  No nausea, no vomiting.  No diarrhea.  No constipation. Genitourinary: Negative for dysuria. Musculoskeletal: Positive left foot pain Skin: Negative for rash. Laceration dorsal aspect of left  foot  Neurological: Negative for headaches, focal weakness or numbness.    ____________________________________________   PHYSICAL EXAM:  VITAL SIGNS: ED Triage Vitals  Enc Vitals Group     BP 12/04/16 1509 138/78     Pulse Rate 12/04/16 1509 88     Resp 12/04/16 1509 20     Temp 12/04/16 1509 98 F (36.7 C)     Temp Source 12/04/16 1509 Oral     SpO2 12/04/16 1509 98 %     Weight 12/04/16 1505 205 lb (93 kg)     Height 12/04/16 1505 5\' 11"  (1.803 m)     Head Circumference --      Peak Flow --      Pain Score 12/04/16 1505 7     Pain Loc --      Pain Edu? --      Excl. in GC? --     Constitutional: Alert and oriented. Well appearing and in no acute distress. Eyes: Conjunctivae are normal. PERRL. EOMI. Head: Atraumatic. Nose: No congestion/rhinnorhea. Mouth/Throat: Mucous membranes are moist.  Oropharynx non-erythematous. Neck: No stridor.  No cervical spine tenderness to palpation. Hematological/Lymphatic/Immunilogical: No cervical lymphadenopathy. Cardiovascular: Normal rate, regular rhythm. Grossly normal heart sounds.  Good peripheral circulation. Respiratory: Normal respiratory effort.  No retractions. Lungs CTAB. Gastrointestinal: Soft and nontender. No distention. No abdominal bruits. No CVA tenderness. Musculoskeletal:No obvious deformity or edema to the left foot. Healing laceration dose aspect of the fourth medical carpal.  Neurologic:  Normal speech and language. No gross focal neurologic deficits are appreciated. No gait instability. Skin:  Skin is warm, dry and intact. No rash noted. Healing laceration dose aspect of left foot Psychiatric: Mood and affect are normal. Speech and behavior are normal.  ____________________________________________   LABS (all labs ordered are listed, but only abnormal results are displayed)  Labs Reviewed - No data to  display ____________________________________________  EKG   ____________________________________________  RADIOLOGY  No acute findings x-ray of the left foot ____________________________________________   PROCEDURES  Procedure(s) performed: None  Procedures  Critical Care performed: No  ____________________________________________   INITIAL IMPRESSION / ASSESSMENT AND PLAN / ED COURSE  Pertinent labs & imaging results that were available during my care of the patient were reviewed by me and considered in my medical decision making (see chart for details).  Contusion and healing laceration dose aspect the left foot. Patient given discharge care instructions. Patient given a work note for 2 days. Patient get a prescription for ibuprofen and tramadol. Patient advised follow-up family doctor condition persists.  Clinical Course    Discussed x-ray finding with patient and place an open shoe for comfort.  ____________________________________________   FINAL CLINICAL IMPRESSION(S) / ED DIAGNOSES  Final diagnoses:  Contusion of left foot, initial encounter      NEW MEDICATIONS STARTED DURING THIS VISIT:  New Prescriptions   IBUPROFEN (ADVIL,MOTRIN) 600 MG TABLET    Take 1 tablet (600 mg total) by mouth every 6 (six) hours as needed.   TRAMADOL (ULTRAM) 50 MG TABLET    Take 1 tablet (50 mg total) by mouth every 6 (six) hours as needed for moderate pain.     Note:  This document was prepared using Dragon voice recognition software and may include unintentional dictation errors.    Joni ReiningRonald K Kayann Maj, PA-C 12/04/16 1617    Sharman CheekPhillip Stafford, MD 12/05/16 [phone]

## 2016-12-04 NOTE — Discharge Instructions (Signed)
open shoe for 2-3 days as needed.

## 2016-12-04 NOTE — ED Triage Notes (Signed)
States he had a cutting board on left foot on Sunday morning  Pain with some bruising noted to lateral foot  Old laceration noted

## 2017-02-21 ENCOUNTER — Emergency Department
Admission: EM | Admit: 2017-02-21 | Discharge: 2017-02-21 | Disposition: A | Discharge status: Home / Self Care | Payer: BLUE CROSS/BLUE SHIELD | Attending: Emergency Medicine | Admitting: Emergency Medicine

## 2017-02-21 DIAGNOSIS — M79672 Pain in left foot: Secondary | ICD-10-CM | POA: Insufficient documentation

## 2017-02-21 DIAGNOSIS — R202 Paresthesia of skin: Secondary | ICD-10-CM | POA: Insufficient documentation

## 2017-02-21 NOTE — ED Notes (Signed)
See triage note.  States he is having pain and tingling to top of left foot for about 1 weeks  Also has had some discomfort to bottom of same foot   Ambulates well to treatment room  No deformity noted denies any recent injury

## 2017-02-21 NOTE — ED Triage Notes (Signed)
Pt states L foot tingling on top of foot. Pt states 3 months ago cut foot and had contusion. Pt ambulatory. Alert and oriented.

## 2017-02-21 NOTE — ED Notes (Signed)
Pt attempted to sign e-signature pad but it didn't cross over. Pt verbalized understanding of DC instructions and ambulated to lobby.

## 2017-02-21 NOTE — Discharge Instructions (Signed)
Your exam is normal today. Your exam does not show any signs of decreased blood flow, skin changes, infection, or deep nerve injury. You should consider changing from the tight socks you have been wearing. Rest with the foot elevated when you experience swelling. Follow-up with a provider at [facility] as needed. Return to the ED as needed.
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
## 2018-10-08 ENCOUNTER — Emergency Department
Admission: EM | Admit: 2018-10-08 | Discharge: 2018-10-08 | Disposition: A | Discharge status: Home / Self Care | Payer: BLUE CROSS/BLUE SHIELD | Attending: Emergency Medicine | Admitting: Emergency Medicine

## 2018-10-08 ENCOUNTER — Other Ambulatory Visit: Payer: Self-pay

## 2018-10-08 ENCOUNTER — Encounter: Payer: Self-pay | Admitting: Emergency Medicine

## 2018-10-08 DIAGNOSIS — I10 Essential (primary) hypertension: Secondary | ICD-10-CM

## 2018-10-08 DIAGNOSIS — Z79899 Other long term (current) drug therapy: Secondary | ICD-10-CM | POA: Insufficient documentation

## 2018-10-08 DIAGNOSIS — R519 Headache, unspecified: Secondary | ICD-10-CM

## 2018-10-08 DIAGNOSIS — R51 Headache: Secondary | ICD-10-CM | POA: Insufficient documentation

## 2018-10-08 HISTORY — DX: Essential (primary) hypertension: I10

## 2018-10-08 LAB — URINALYSIS, COMPLETE (UACMP) WITH MICROSCOPIC
BILIRUBIN URINE: NEGATIVE
Bacteria, UA: NONE SEEN
GLUCOSE, UA: NEGATIVE mg/dL
HGB URINE DIPSTICK: NEGATIVE
Ketones, ur: NEGATIVE mg/dL
NITRITE: NEGATIVE
PH: 6 (ref 5.0–8.0)
Protein, ur: NEGATIVE mg/dL
Specific Gravity, Urine: 1.017 (ref 1.005–1.030)

## 2018-10-08 LAB — URINE DRUG SCREEN, QUALITATIVE (ARMC ONLY)
Amphetamines, Ur Screen: NOT DETECTED
BARBITURATES, UR SCREEN: NOT DETECTED
Benzodiazepine, Ur Scrn: NOT DETECTED
CANNABINOID 50 NG, UR ~~LOC~~: POSITIVE — AB
COCAINE METABOLITE, UR ~~LOC~~: POSITIVE — AB
MDMA (Ecstasy)Ur Screen: NOT DETECTED
Methadone Scn, Ur: NOT DETECTED
OPIATE, UR SCREEN: NOT DETECTED
PHENCYCLIDINE (PCP) UR S: NOT DETECTED
Tricyclic, Ur Screen: NOT DETECTED

## 2018-10-08 LAB — CBC
HEMATOCRIT: 42.8 % (ref 39.0–52.0)
HEMOGLOBIN: 14.1 g/dL (ref 13.0–17.0)
MCH: 28.8 pg (ref 26.0–34.0)
MCHC: 32.9 g/dL (ref 30.0–36.0)
MCV: 87.3 fL (ref 80.0–100.0)
NRBC: 0 % (ref 0.0–0.2)
PLATELETS: 251 10*3/uL (ref 150–400)
RBC: 4.9 MIL/uL (ref 4.22–5.81)
RDW: 14.6 % (ref 11.5–15.5)
WBC: 8.7 10*3/uL (ref 4.0–10.5)

## 2018-10-08 LAB — BASIC METABOLIC PANEL
Anion gap: 7 (ref 5–15)
BUN: 6 mg/dL (ref 6–20)
CHLORIDE: 105 mmol/L (ref 98–111)
CO2: 28 mmol/L (ref 22–32)
CREATININE: 0.75 mg/dL (ref 0.61–1.24)
Calcium: 8.8 mg/dL — ABNORMAL LOW (ref 8.9–10.3)
GFR calc non Af Amer: >60 mL/min (ref >60)
Glucose, Bld: 105 mg/dL — ABNORMAL HIGH (ref 70–99)
POTASSIUM: 3.2 mmol/L — AB (ref 3.5–5.1)
SODIUM: 140 mmol/L (ref 135–145)

## 2018-10-08 MED ORDER — HYDROCHLOROTHIAZIDE 25 MG PO TABS
25.0000 mg | ORAL_TABLET | Freq: Once | ORAL | Status: AC
  Administered 2018-10-08: 25 mg via ORAL
  Filled 2018-10-08: qty 1

## 2018-10-08 MED ORDER — HYDROCHLOROTHIAZIDE 25 MG PO TABS: 25.0000 mg | ORAL_TABLET | Freq: Every day | ORAL | 1 refills | Status: AC

## 2018-10-08 MED ORDER — ACETAMINOPHEN 500 MG PO TABS
1000.0000 mg | ORAL_TABLET | Freq: Once | ORAL | Status: AC
  Administered 2018-10-08: 1000 mg via ORAL
  Filled 2018-10-08: qty 2

## 2018-10-08 NOTE — ED Provider Notes (Signed)
[facility] Emergency Department Provider Note  ____________________________________________  Time seen: Approximately 9:38 AM  I have reviewed the triage vital signs and the nursing notes.   HISTORY  Chief Complaint Headache   HPI Glenn Horn is a 37 y.o. male with history of untreated hypertension who presents for evaluation of headache.  Patient reports on and off headaches for the last month.  The headache is usually frontal and on the top of his head, pounding, associated with bilateral blurry vision.  Patient reports that the headache currently is mild but he could not concentrate at work and therefore he was sent to the emergency room for evaluation.  He reports history of smoking half a pack a day, drinks about a beer every night and uses cocaine intermittently.  Last cocaine was 2 nights ago.  He reports that the headaches usually worse after he uses cocaine.  He denies slurred speech, facial droop, unilateral weakness or numbness, vertigo, gait instability.  He reports that the blurry vision resolves once the headache resolves.  He has not taken anything at home for the headaches.  He reports being told several times to prior ED visits that he had elevated blood pressure but according to the patient he was never started on any medication.  Has not seen a primary care doctor in several years.  Past Medical History:  Diagnosis Date  . Hypertension     Prior to Admission medications   Medication Sig Start Date End Date Taking? Authorizing Provider  hydrochlorothiazide (HYDRODIURIL) 25 MG tablet Take 1 tablet (25 mg total) by mouth daily. 10/08/18   Nita Sickle, MD  ibuprofen (ADVIL,MOTRIN) 600 MG tablet Take 1 tablet (600 mg total) by mouth every 6 (six) hours as needed. 12/04/16   Joni Reining, PA-C  meloxicam (MOBIC) 15 MG tablet Take 1 tablet (15 mg total) by mouth daily. 10/11/16   Evon Slack, PA-C  traMADol (ULTRAM) 50 MG tablet  Take 1 tablet (50 mg total) by mouth every 6 (six) hours as needed for moderate pain. 12/04/16   Joni Reining, PA-C    Allergies Codeine  No family history on file.  Social History Social History   Tobacco Use  . Smoking status: Never Smoker  . Smokeless tobacco: Never Used  Substance Use Topics  . Alcohol use: No  . Drug use: Not on file    Review of Systems  Constitutional: Negative for fever. Eyes: + blurry vision ENT: Negative for sore throat. Neck: No neck pain  Cardiovascular: Negative for chest pain. Respiratory: Negative for shortness of breath. Gastrointestinal: Negative for abdominal pain, vomiting or diarrhea. Genitourinary: Negative for dysuria. Musculoskeletal: Negative for back pain. Skin: Negative for rash. Neurological: Negative for  weakness or numbness. + HA Psych: No SI or HI  ____________________________________________   PHYSICAL EXAM:  VITAL SIGNS: ED Triage Vitals  Enc Vitals Group     BP 10/08/18 0903 (!) 181/135     Pulse Rate 10/08/18 0903 69     Resp 10/08/18 0903 16     Temp 10/08/18 0903 98.5 F (36.9 C)     Temp Source 10/08/18 0903 Oral     SpO2 10/08/18 0903 100 %     Weight 10/08/18 0903 215 lb (97.5 kg)     Height 10/08/18 0903 6' (1.829 m)     Head Circumference --      Peak Flow --      Pain Score 10/08/18 0902 4  Pain Loc --      Pain Edu? --      Excl. in GC? --     Constitutional: Alert and oriented. Well appearing and in no apparent distress. HEENT:      Head: Normocephalic and atraumatic.         Eyes: Conjunctivae are normal. Sclera is non-icteric.       Mouth/Throat: Mucous membranes are moist.       Neck: Supple with no signs of meningismus. Cardiovascular: Regular rate and rhythm. No murmurs, gallops, or rubs. 2+ symmetrical distal pulses are present in all extremities. No JVD. Respiratory: Normal respiratory effort. Lungs are clear to auscultation bilaterally. No wheezes, crackles, or rhonchi.    Gastrointestinal: Soft, non tender, and non distended with positive bowel sounds. No rebound or guarding. Musculoskeletal: Nontender with normal range of motion in all extremities. No edema, cyanosis, or erythema of extremities. Neurologic: Normal speech and language. A & O x3, PERRL, EOMI, no nystagmus, intact visual fields, CN II-XII intact, motor testing reveals good tone and bulk throughout. There is no evidence of pronator drift or dysmetria. Muscle strength is 5/5 throughout. Sensory examination is intact. Gait is normal. Skin: Skin is warm, dry and intact. No rash noted. Psychiatric: Mood and affect are normal. Speech and behavior are normal.  ____________________________________________   LABS (all labs ordered are listed, but only abnormal results are displayed)  Labs Reviewed  BASIC METABOLIC PANEL - Abnormal; Notable for the following components:      Result Value   Potassium 3.2 (*)    Glucose, Bld 105 (*)    Calcium 8.8 (*)    All other components within normal limits  URINALYSIS, COMPLETE (UACMP) WITH MICROSCOPIC - Abnormal; Notable for the following components:   Color, Urine YELLOW (*)    APPearance CLEAR (*)    Leukocytes, UA TRACE (*)    All other components within normal limits  CBC  URINE DRUG SCREEN, QUALITATIVE ([facility] ONLY)   ____________________________________________  EKG  ED ECG REPORT I, Nita Sickle, the attending physician, personally viewed and interpreted this ECG.  Sinus bradycardia, rate of 59, normal intervals, normal axis, no ST elevations or depressions, T wave inversions in 3 and aVF.  Unchanged from prior from 2009. ____________________________________________  RADIOLOGY  none  ____________________________________________   PROCEDURES  Procedure(s) performed: None Procedures Critical Care performed:  None ____________________________________________   INITIAL IMPRESSION / ASSESSMENT AND PLAN / ED COURSE   37 y.o. male  with history of untreated hypertension who presents for evaluation of intermittent frontal throbbing headaches associated with blurry vision for a month in the setting of untreated hypertension, alcohol and cocaine use.  Patient arrives with BP elevated between 160s and 180s, he is completely neurologically intact, normal visual fields. Last cocaine 2 days ago which is probably also contributing to his high blood pressure and headaches.  Patient is neuro intact, no thunderclap headache, with no trauma and no blood thinners therefore low suspicion for intracranial hemorrhage, subarachnoid hemorrhage.  No signs of meningitis.  No signs of GCA.  Will check labs for endorgan damage.  Will start patient on hydrochlorothiazide.  Headaches currently 2 out of 10 will give Tylenol.  Clinical Course as of Oct 08 1158  Tue Oct 08, 2018  1157 Visual acuity done by tech showing 20/200 bilaterally. I redid the test myself and got 20/40 on the R and 20/30 on the L which seems more accurate considering that patient is able to drive and function otherwise.  BP trending down. Labs with no evidence of acute endorgan damage.  Patient was started on hydrochlorothiazide and will be discharged home with a prescription.  Recommend close follow-up with primary care doctor and ophthalmology for eye evaluation.  Discussed my standard return precautions.   [CV]    Clinical Course User Index [CV] Don Perking [place], MD     As part of my medical decision making, I reviewed the following data within the electronic MEDICAL RECORD NUMBER Nursing notes reviewed and incorporated, Labs reviewed , EKG interpreted , Old EKG reviewed, Old chart reviewed, Notes from prior ED visits and [place] Controlled Substance Database    Pertinent labs & imaging results that were available during my care of the patient were reviewed by me and considered in my medical decision making (see chart for  details).    ____________________________________________   FINAL CLINICAL IMPRESSION(S) / ED DIAGNOSES  Final diagnoses:  Acute nonintractable headache, unspecified headache type  Hypertension, unspecified type      NEW MEDICATIONS STARTED DURING THIS VISIT:  ED Discharge Orders         Ordered    hydrochlorothiazide (HYDRODIURIL) 25 MG tablet  Daily     10/08/18 1158           Note:  This document was prepared using Dragon voice recognition software and may include unintentional dictation errors.    Don Perking, [place], MD 10/08/18 1200

## 2018-10-08 NOTE — ED Notes (Signed)
Pt verbalized discharge instructions and has no questions at this time 

## 2018-10-08 NOTE — ED Notes (Signed)
Pt presents via pov from home with headache x 1 month; states sometimes his vision blurs. He reports that he is unable to sleep because of the headache and has been taking tylenol PM with no relief. Pt denies hx of htn. Pt alert & oriented with no neuro deficits noted.

## 2018-10-08 NOTE — Discharge Instructions (Addendum)

## 2018-10-08 NOTE — ED Triage Notes (Addendum)
PT c/o headache x2-3wks. Pt states " my eyes seem to be crossing too". PT ambulatory, NAD noted. Denies any other symptoms at this time . Hx of HTN, 185/130 in triage

## 2018-10-14 ENCOUNTER — Other Ambulatory Visit: Payer: Self-pay

## 2018-10-14 ENCOUNTER — Emergency Department
Admission: EM | Admit: 2018-10-14 | Discharge: 2018-10-14 | Disposition: A | Discharge status: Home / Self Care | Payer: Self-pay | Attending: Emergency Medicine | Admitting: Emergency Medicine

## 2018-10-14 ENCOUNTER — Encounter: Payer: Self-pay | Admitting: Emergency Medicine

## 2018-10-14 ENCOUNTER — Emergency Department: Payer: Self-pay

## 2018-10-14 DIAGNOSIS — I10 Essential (primary) hypertension: Secondary | ICD-10-CM | POA: Insufficient documentation

## 2018-10-14 DIAGNOSIS — S60221A Contusion of right hand, initial encounter: Secondary | ICD-10-CM | POA: Insufficient documentation

## 2018-10-14 DIAGNOSIS — W010XXA Fall on same level from slipping, tripping and stumbling without subsequent striking against object, initial encounter: Secondary | ICD-10-CM | POA: Insufficient documentation

## 2018-10-14 DIAGNOSIS — Y9301 Activity, walking, marching and hiking: Secondary | ICD-10-CM | POA: Insufficient documentation

## 2018-10-14 DIAGNOSIS — W19XXXA Unspecified fall, initial encounter: Secondary | ICD-10-CM

## 2018-10-14 DIAGNOSIS — Y999 Unspecified external cause status: Secondary | ICD-10-CM | POA: Insufficient documentation

## 2018-10-14 DIAGNOSIS — Y929 Unspecified place or not applicable: Secondary | ICD-10-CM | POA: Insufficient documentation

## 2018-10-14 DIAGNOSIS — S39012A Strain of muscle, fascia and tendon of lower back, initial encounter: Secondary | ICD-10-CM | POA: Insufficient documentation

## 2018-10-14 MED ORDER — BACLOFEN 10 MG PO TABS: 10.0000 mg | ORAL_TABLET | Freq: Three times a day (TID) | ORAL | 1 refills | Status: AC

## 2018-10-14 MED ORDER — MELOXICAM 15 MG PO TABS: 15.0000 mg | ORAL_TABLET | Freq: Every day | ORAL | 0 refills | Status: DC

## 2018-10-14 NOTE — ED Provider Notes (Signed)
[facility] Emergency Department Provider Note  ____________________________________________   First MD Initiated Contact with Patient 10/14/18 1509     (approximate)  I have reviewed the triage vital signs and the nursing notes.   HISTORY  Chief Complaint Hand Pain and Back Pain    HPI Glenn Horn is a 37 y.o. male emergency department stating that he slipped and fell last Thursday and landed on his right hand.  He denies any wrist pain or elbow pain.  He states that his back started to hurt about 2 days later.  He feels like he pulled muscles in the lower back.  He denies any head injury or loss of consciousness.  He denies any other complaints at this time.  Remainder of review of systems is negative    Past Medical History:  Diagnosis Date  . Hypertension     There are no active problems to display for this patient.   History reviewed. No pertinent surgical history.  Prior to Admission medications   Medication Sig Start Date End Date Taking? Authorizing Provider  baclofen (LIORESAL) 10 MG tablet Take 1 tablet (10 mg total) by mouth 3 (three) times daily. 10/14/18 10/14/19  Amere Bricco, Roselyn Bering, PA-C  hydrochlorothiazide (HYDRODIURIL) 25 MG tablet Take 1 tablet (25 mg total) by mouth daily. 10/08/18   Nita Sickle, MD  meloxicam (MOBIC) 15 MG tablet Take 1 tablet (15 mg total) by mouth daily. 10/14/18   Faythe Ghee, PA-C    Allergies Codeine  History reviewed. No pertinent family history.  Social History Social History   Tobacco Use  . Smoking status: Never Smoker  . Smokeless tobacco: Never Used  Substance Use Topics  . Alcohol use: No  . Drug use: Not on file    Review of Systems  Constitutional: No fever/chills Eyes: No visual changes. ENT: No sore throat. Respiratory: Denies cough Genitourinary: Negative for dysuria. Musculoskeletal: Positive for back pain.  Positive for right hand pain Skin: Negative for  rash.    ____________________________________________   PHYSICAL EXAM:  VITAL SIGNS: ED Triage Vitals  Enc Vitals Group     BP 10/14/18 1354 (S) (!) 153/112     Pulse Rate 10/14/18 1354 81     Resp 10/14/18 1354 18     Temp 10/14/18 1354 98.1 F (36.7 C)     Temp Source 10/14/18 1354 Oral     SpO2 10/14/18 1354 100 %     Weight 10/14/18 1355 215 lb (97.5 kg)     Height 10/14/18 1355 6' (1.829 m)     Head Circumference --      Peak Flow --      Pain Score 10/14/18 1355 10     Pain Loc --      Pain Edu? --      Excl. in GC? --     Constitutional: Alert and oriented. Well appearing and in no acute distress. Eyes: Conjunctivae are normal.  Head: Atraumatic. Nose: No congestion/rhinnorhea. Mouth/Throat: Mucous membranes are moist.   Neck:  supple no lymphadenopathy noted Cardiovascular: Normal rate, regular rhythm. Heart sounds are normal Respiratory: Normal respiratory effort.  No retractions, lungs c t a  GU: deferred Musculoskeletal: FROM all extremities, warm and well perfused.  The right hand is tender across the fifth and fourth metacarpals.  The lumbar spine is not tender.  Paravertebral muscles are tender bilaterally.  Neurovascular is intact. Neurologic:  Normal speech and language.  Skin:  Skin is warm, dry and  intact. No rash noted. Psychiatric: Mood and affect are normal. Speech and behavior are normal.  ____________________________________________   LABS (all labs ordered are listed, but only abnormal results are displayed)  Labs Reviewed - No data to display ____________________________________________   ____________________________________________  RADIOLOGY  X-ray of the right hand is negative  ____________________________________________   PROCEDURES  Procedure(s) performed: No  Procedures    ____________________________________________   INITIAL IMPRESSION / ASSESSMENT AND PLAN / ED COURSE  Pertinent labs & imaging results that  were available during my care of the patient were reviewed by me and considered in my medical decision making (see chart for details).   Patient is 37 year old male presents emergency department complaining of right hand pain after a fall and low back pain that started 2 days after the fall.  Physical exam patient appears well.  He is able to walk on his toes and heels.  Is able to bend over and touch his toes.  The right hand is swollen and tender.  X-ray of the right hand is negative.  Plan the findings to the patient.  He was given a prescription for meloxicam and baclofen.  He is to follow-up with orthopedics if not better in 5 7 days.  States he understands will comply.  Was discharged in stable condition.     As part of my medical decision making, I reviewed the following data within the electronic MEDICAL RECORD NUMBER Nursing notes reviewed and incorporated, Old chart reviewed, Radiograph reviewed by the right hand is negative for fracture, Notes from prior ED visits and [place] Controlled Substance Database  ____________________________________________   FINAL CLINICAL IMPRESSION(S) / ED DIAGNOSES  Final diagnoses:  Contusion of right hand, initial encounter  Strain of lumbar region, initial encounter  Fall, initial encounter      NEW MEDICATIONS STARTED DURING THIS VISIT:  Discharge Medication List as of 10/14/2018  4:35 PM    START taking these medications   Details  baclofen (LIORESAL) 10 MG tablet Take 1 tablet (10 mg total) by mouth 3 (three) times daily., Starting Mon 10/14/2018, Until Tue 10/14/2019, Print         Note:  This document was prepared using Dragon voice recognition software and may include unintentional dictation errors.    Faythe Ghee, PA-C 10/14/18 1718    Rockne Menghini, MD 10/14/18 [phone]

## 2018-10-14 NOTE — Discharge Instructions (Signed)
Follow-up with [facility] clinic orthopedics if not better in 3 to 5 days.  Return emergency department if worsening.

## 2018-10-14 NOTE — ED Triage Notes (Signed)
Pt arrived via POV with reports of tripping and falling on Thursday landing on right hand.  Pt c/o swelling to right hand since, has tried ice and elevating but no relief.   Pt reports low back pain states after periods of sitting his back "locks up" on him.

## 2019-11-15 IMAGING — DX DG HAND COMPLETE 3+V*R*
3 series · 3 of 3 positions shown · non-contrast
Comparison: None.

CLINICAL DATA: Pain following fall

EXAM:
RIGHT HAND - COMPLETE 3+ VIEW

[hand ap]
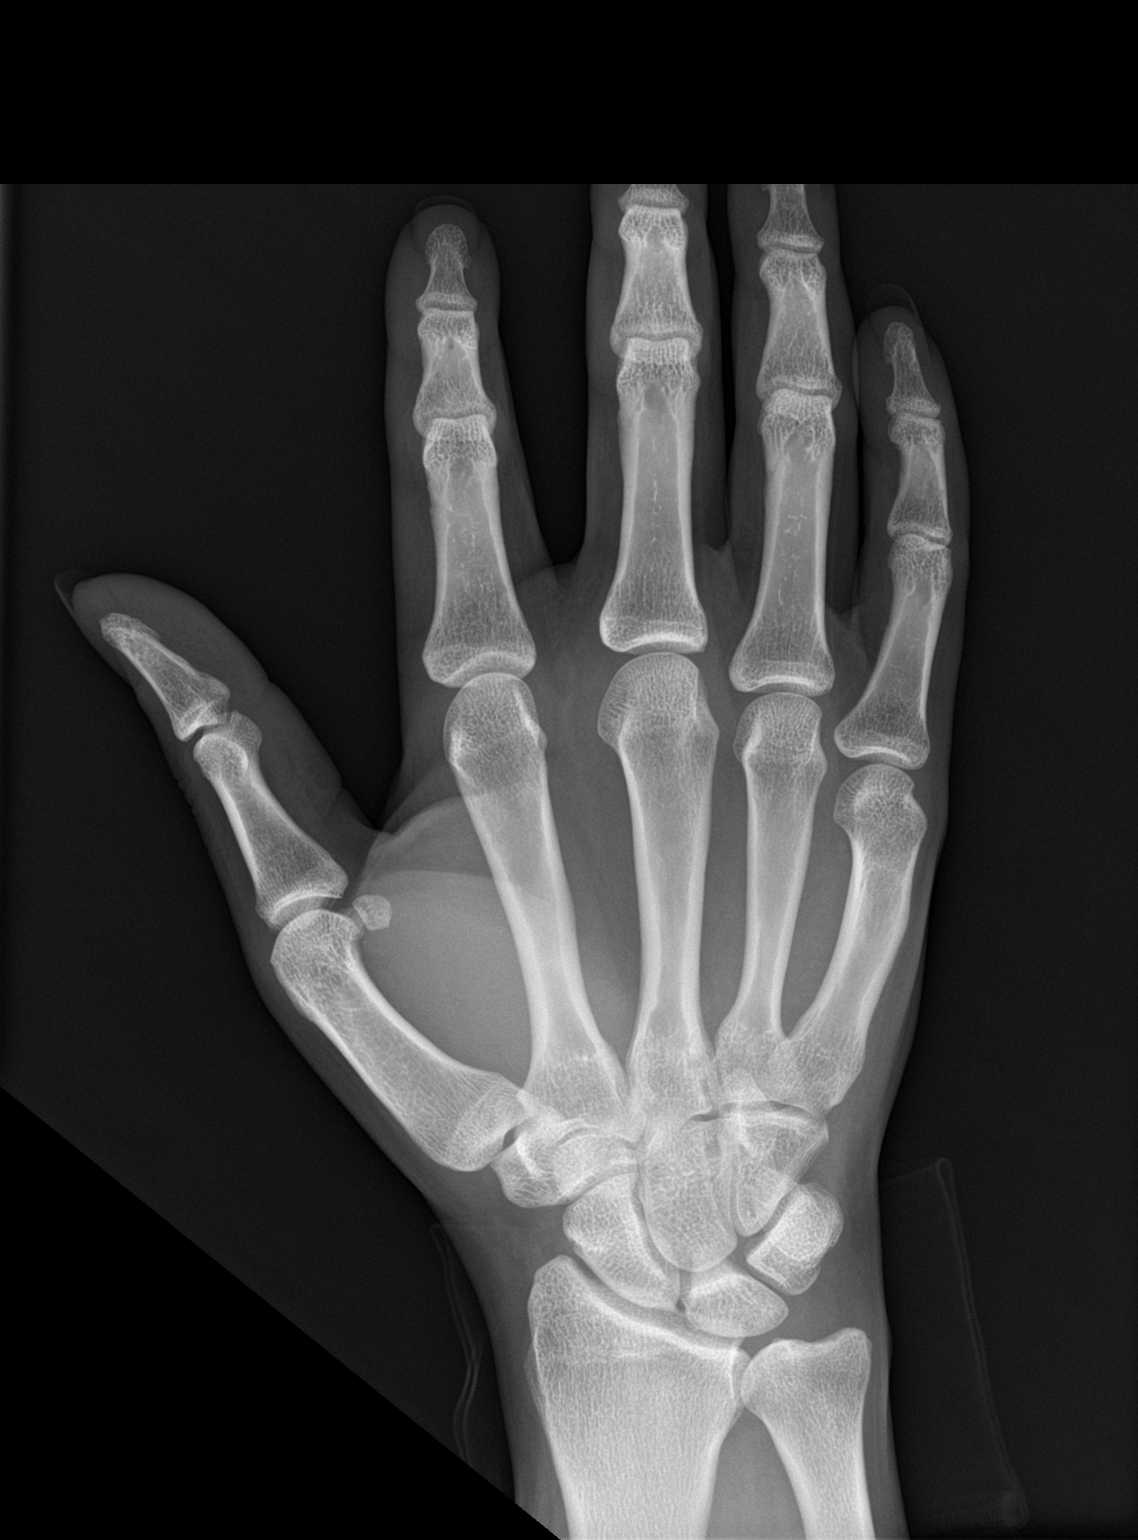

[hand obl]
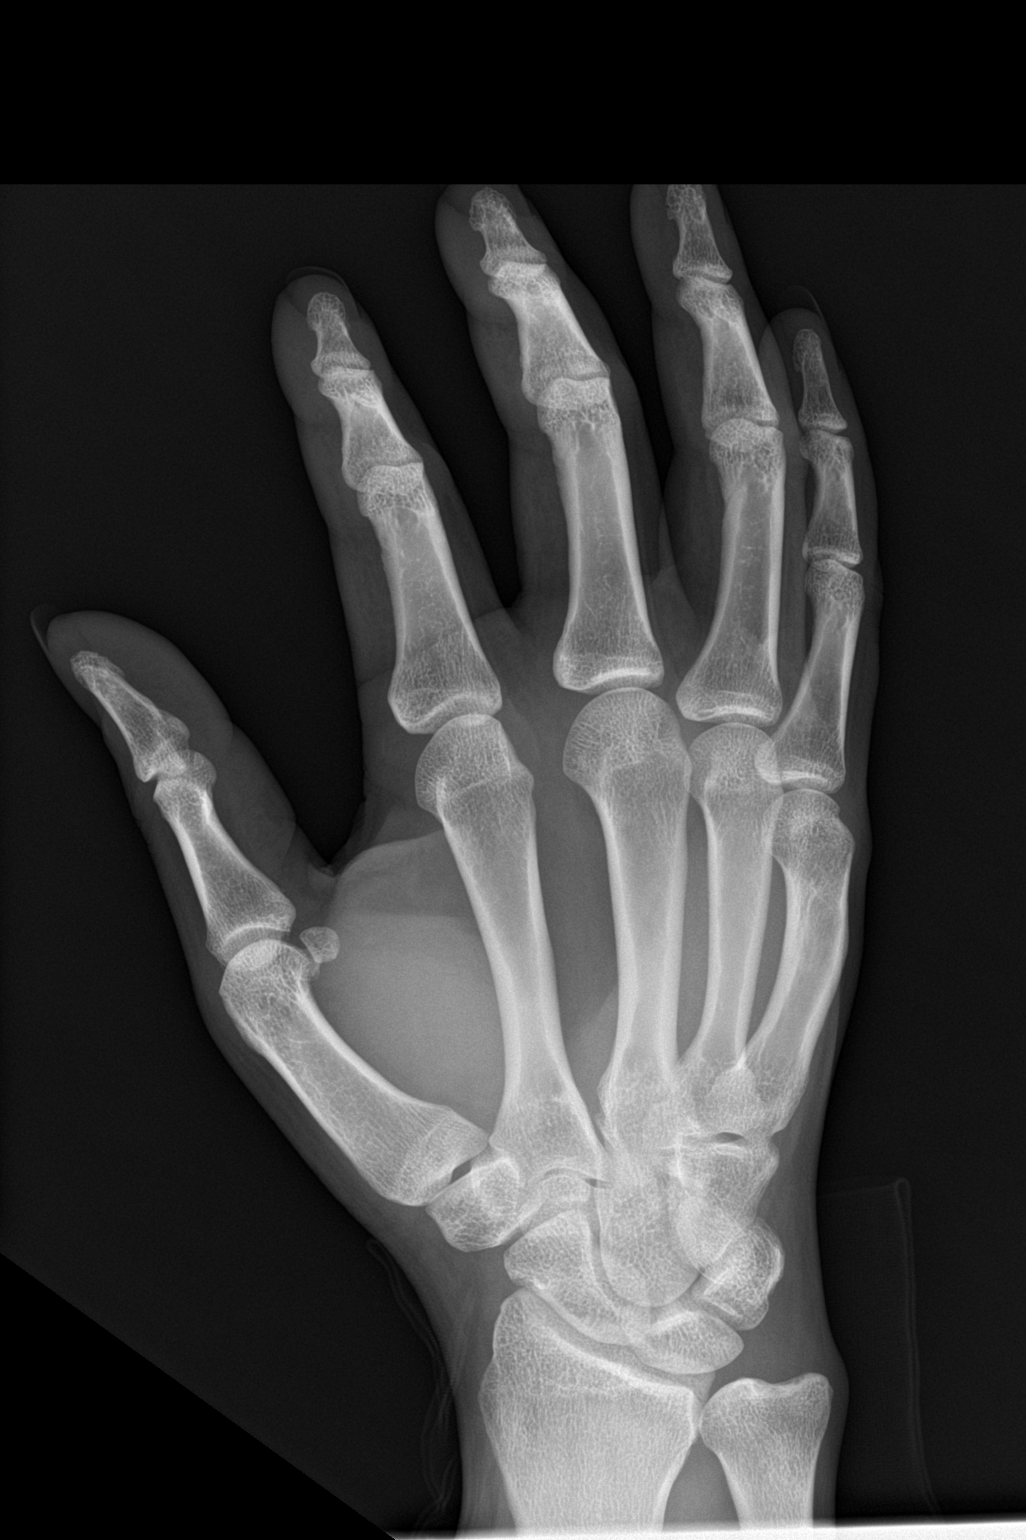

[hand lat]
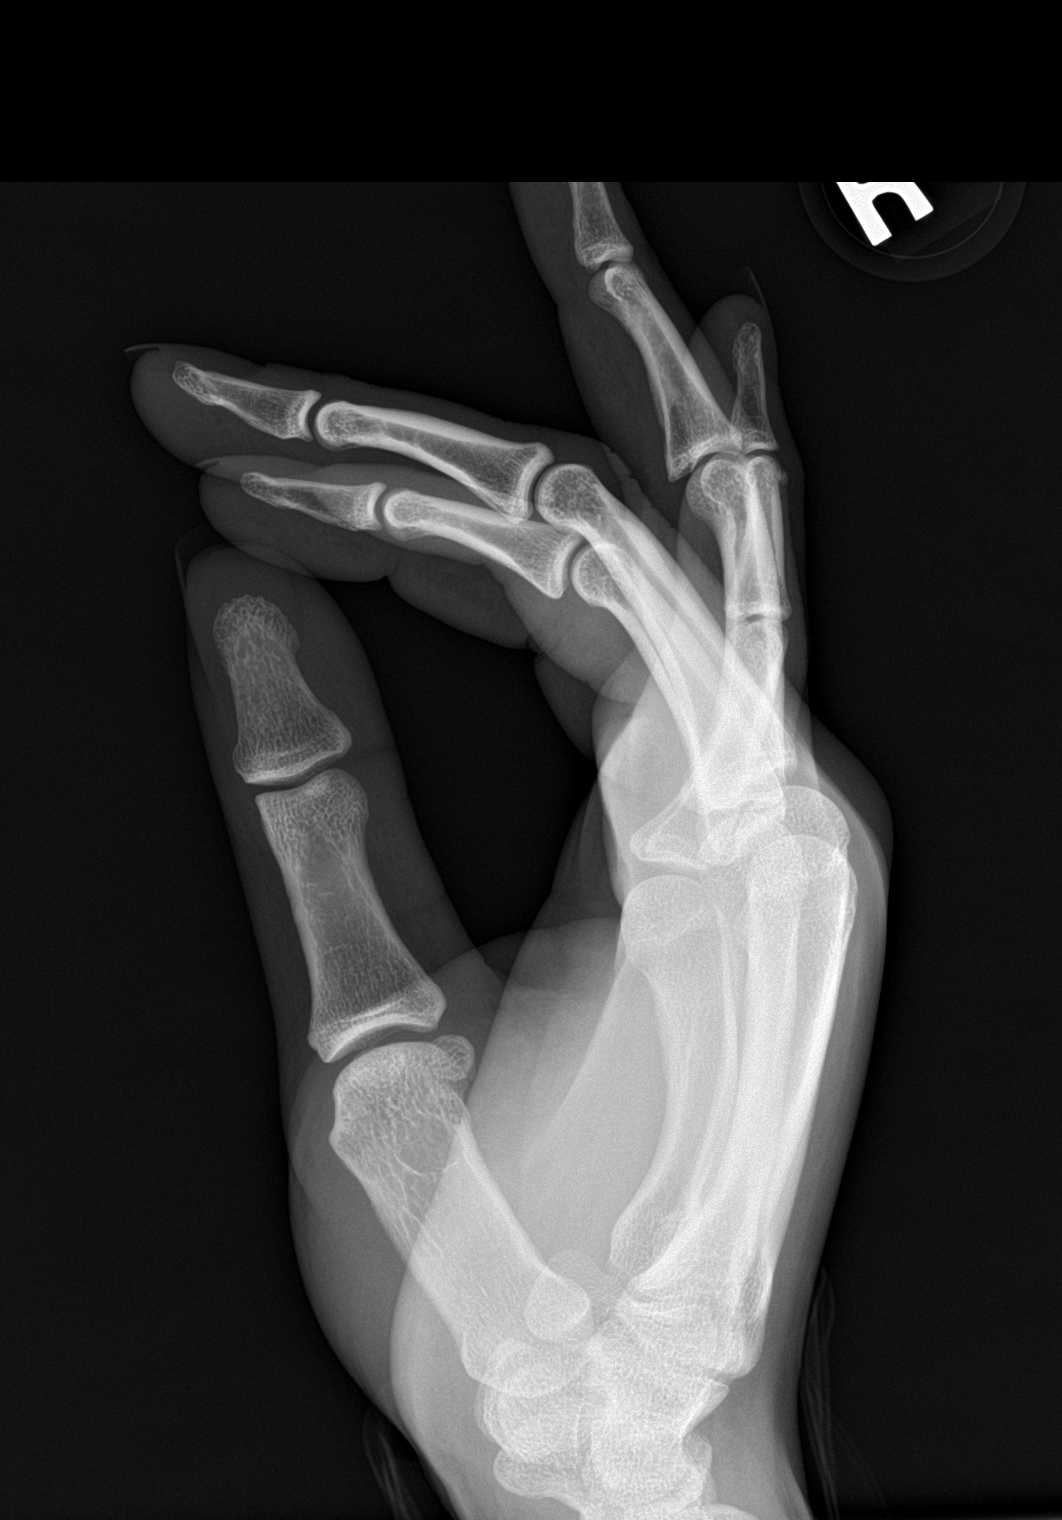

[3 of 3 positions shown; findings below may reference images not displayed]

FINDINGS: Frontal, oblique, and lateral views were obtained. There is evidence
of an old healed fracture of the fifth metacarpal with mild volar
angulation distally. There is no evident acute fracture or
dislocation. Joint spaces appear normal. No erosive change.
IMPRESSION: Old healed fracture distal fifth metacarpal with remodeling. No
acute fracture or dislocation. No evident arthropathy.

## 2019-11-26 ENCOUNTER — Ambulatory Visit: Payer: Self-pay | Admitting: Family Medicine

## 2019-11-26 ENCOUNTER — Encounter: Payer: Self-pay | Admitting: Family Medicine

## 2019-11-26 ENCOUNTER — Other Ambulatory Visit: Payer: Self-pay

## 2019-11-26 DIAGNOSIS — Z113 Encounter for screening for infections with a predominantly sexual mode of transmission: Secondary | ICD-10-CM

## 2019-11-26 DIAGNOSIS — A549 Gonococcal infection, unspecified: Secondary | ICD-10-CM

## 2019-11-26 LAB — GRAM STAIN

## 2019-11-26 MED ORDER — AZITHROMYCIN 500 MG PO TABS
1000.0000 mg | ORAL_TABLET | Freq: Once | ORAL | Status: AC
  Administered 2019-11-26: 1000 mg via ORAL

## 2019-11-26 MED ORDER — CEFTRIAXONE SODIUM 250 MG IJ SOLR
250.0000 mg | Freq: Once | INTRAMUSCULAR | Status: AC
  Administered 2019-11-26: 250 mg via INTRAMUSCULAR

## 2019-11-26 NOTE — Progress Notes (Signed)
    STI clinic/screening visit  Subjective:  Glenn Horn is a 38 y.o. male being seen today for an STI screening visit. The patient reports they do have symptoms.  Patient reports that they do not desire a pregnancy in the next year.   They reported they are not interested in discussing contraception today.   Patient has the following medical conditions:  There are no active problems to display for this patient.   HPI  Patient reports that he has had gray/yellow disch x 5 days.  He had some dysuria but this has resolved.  He has had 7 partners in 60 days and never uses condoms.  See flowsheet for further details and programmatic requirements.    The following portions of the patient's history were reviewed and updated as appropriate: allergies, current medications, past medical history, past social history, past surgical history and problem list.  Objective:  There were no vitals filed for this visit.  Physical Exam Constitutional:      Appearance: Normal appearance.  HENT:     Mouth/Throat:     Pharynx: Oropharynx is clear. No oropharyngeal exudate or posterior oropharyngeal erythema.  Neck:     Musculoskeletal: Neck supple. No muscular tenderness.  Abdominal:     Hernia: There is no hernia in the left inguinal area or right inguinal area.  Genitourinary:    Pubic Area: No rash.      Penis: Discharge present. No tenderness or lesions.      Scrotum/Testes: Normal.     Epididymis:     Right: Normal.     Left: Normal.     Comments: White, mucous disch from urethra, Lymphadenopathy:     Cervical: No cervical adenopathy.     Lower Body: No right inguinal adenopathy. No left inguinal adenopathy.  Skin:    General: Skin is warm and dry.     Findings: No lesion or rash.  Neurological:     Mental Status: He is alert.    Assessment and Plan:  [name] is a 38 y.o. male presenting to the [facility] Department for STI screening  1. Screening examination  for venereal disease  - Gram stain - Gonococcus culture - HIV/HCV [place] [facility] - Hepatitis Serology, [place] [facility] - Syphilis Serology, [place] [facility] - Gonococcus culture  2. Gonorrhea  - cefTRIAXone (ROCEPHIN) injection 250 mg IM - azithromycin (ZITHROMAX) tablet 1,000 mg once Co to use condoms for STD prevention and to reduce number of sexual partners to reduce risks of STDs. Co. To notify partners that they need treatment and evaluation for GC.   No follow-ups on file.  No future appointments.  Hassell Done, FNP

## 2019-12-04 LAB — HEPATITIS B SURFACE ANTIGEN

## 2019-12-09 LAB — HM HIV SCREENING LAB: HM HIV Screening: NEGATIVE

## 2019-12-09 LAB — HM HEPATITIS C SCREENING LAB: HM Hepatitis Screen: NEGATIVE

## 2020-01-19 NOTE — Addendum Note (Signed)
Addended by: Geanie Berlin on: 01/19/2020 03:15 PM   Modules accepted: Orders

## 2020-08-10 ENCOUNTER — Ambulatory Visit: Payer: Self-pay

## 2020-08-11 ENCOUNTER — Ambulatory Visit: Payer: Self-pay

## 2020-08-11 ENCOUNTER — Ambulatory Visit: Payer: Self-pay | Admitting: Physician Assistant

## 2020-08-11 ENCOUNTER — Other Ambulatory Visit: Payer: Self-pay

## 2020-08-11 DIAGNOSIS — B009 Herpesviral infection, unspecified: Secondary | ICD-10-CM

## 2020-08-11 DIAGNOSIS — Z113 Encounter for screening for infections with a predominantly sexual mode of transmission: Secondary | ICD-10-CM

## 2020-08-11 MED ORDER — ACYCLOVIR 400 MG PO TABS: 400.0000 mg | ORAL_TABLET | Freq: Three times a day (TID) | ORAL | 0 refills | Status: DC

## 2020-08-13 MED ORDER — ACYCLOVIR 800 MG PO TABS: 800.0000 mg | ORAL_TABLET | Freq: Three times a day (TID) | ORAL | 12 refills | Status: AC

## 2020-08-13 NOTE — Progress Notes (Signed)
S:  Patient into clinic requesting medicine for HSV.  States that he has not had any outbreak for 1-2 years but has had a lot of stress lately and has had a couple of outbreaks. Declines all STD screening and blood work today and requests treatment for HSV infection only.  Denies chronic conditions, surgeries and regular medicines. O:  WDWN male in NAD, A&O x 3, pleasant and cooperative. A/P:  1.  Patient with history of HSV-2 infection. 2.  Requests treatment for HSV-2 due to recent outbreaks. 3.  Acyclovir 400 mg #240 1 po TID for 5 days for each outbreak until medicine is gone or patient can take BID daily for several months to prevent outbreaks. 4.  Rx for Acyclovir 800mg  #6 1 po TID for 2 days as needed for outbreaks with refills for 1 year written and given to patient. 5.  No sex while having an outbreak and use condoms with all sex. 6.  RTC prn.

## 2020-10-06 ENCOUNTER — Ambulatory Visit: Payer: Self-pay

## 2020-10-06 ENCOUNTER — Other Ambulatory Visit: Payer: Self-pay

## 2020-10-06 ENCOUNTER — Ambulatory Visit: Payer: Self-pay | Admitting: Physician Assistant

## 2020-10-06 DIAGNOSIS — Z5321 Procedure and treatment not carried out due to patient leaving prior to being seen by health care provider: Secondary | ICD-10-CM

## 2020-10-06 NOTE — Progress Notes (Signed)
Patient scheduled for IS appt today.  Per CNA, patient with cough, and sinus symptoms.  Patient also told CNA that he had a "sinus infection".  Patient given  Information on getting tested for COVID and to RTC/reschedule once he has had a negative COVID test per CD RN.

## 2020-10-07 ENCOUNTER — Encounter: Payer: Self-pay | Admitting: Physician Assistant

## 2020-10-07 ENCOUNTER — Ambulatory Visit: Payer: Self-pay | Admitting: Physician Assistant

## 2020-10-07 DIAGNOSIS — Z113 Encounter for screening for infections with a predominantly sexual mode of transmission: Secondary | ICD-10-CM

## 2020-10-07 DIAGNOSIS — Z299 Encounter for prophylactic measures, unspecified: Secondary | ICD-10-CM

## 2020-10-07 LAB — GRAM STAIN

## 2020-10-07 MED ORDER — PENICILLIN G BENZATHINE 1200000 UNIT/2ML IM SUSP
2.4000 10*6.[IU] | Freq: Once | INTRAMUSCULAR | Status: AC
  Administered 2020-10-07: 2.4 10*6.[IU] via INTRAMUSCULAR

## 2020-10-07 NOTE — Progress Notes (Signed)
Gram stain reviewed by provider. Tx'd with Bicillin per Tulia PA order. Patient refused to stay for observation after injections. Richmond Campbell, RN

## 2020-10-07 NOTE — Progress Notes (Signed)
[facility] Department STI clinic/screening visit  Subjective:  Hillman Attig is a 39 y.o. male being seen today for an STI screening visit. The patient reports they do have symptoms.    Patient has the following medical conditions:  There are no problems to display for this patient.    Chief Complaint  Patient presents with  . SEXUALLY TRANSMITTED DISEASE    screening    HPI  Patient reports that he has had "bumps" on his scrotum, upper inner legs for about 5 days.  States that the bumps are different than when he has a HSV outbreak and are slightly sore but not painful.  Denies any known exposure to Syphilis.  States that his last HIV test was 4 years ago and last void prior to sample collection for Gram stain was 2 hr ago.     See flowsheet for further details and programmatic requirements.    The following portions of the patient's history were reviewed and updated as appropriate: allergies, current medications, past medical history, past social history, past surgical history and problem list.  Objective:  There were no vitals filed for this visit.  Physical Exam Constitutional:      General: He is not in acute distress.    Appearance: Normal appearance.  HENT:     Head: Normocephalic and atraumatic.     Comments: No nits,lice, or hair loss. No cervical, supraclavicular or axillary adenopathy.    Mouth/Throat:     Mouth: Mucous membranes are moist.     Pharynx: Oropharynx is clear. No oropharyngeal exudate or posterior oropharyngeal erythema.  Eyes:     Conjunctiva/sclera: Conjunctivae normal.  Pulmonary:     Effort: Pulmonary effort is normal.  Abdominal:     Palpations: Abdomen is soft. There is no mass.     Tenderness: There is no abdominal tenderness. There is no guarding or rebound.  Genitourinary:    Penis: Normal.      Testes: Normal.     Comments: Pubic area without nits, lice, hair loss, edema, erythema, and inguinal adenopathy. Inguinal  folds and scrotum bilaterally with multiple, partially healed, moist lesions, nt, small amount of exudate. No pain to palpation, due to partial healing unable to tell if areas were ulcerative or condyloma lata. Penis uncircumcised with similar lesions on foreskin and tip of penis that are in inguinal folds.  Unable to tell whether the lesions were ulcerative or possible condyloma lata due to partial healing. Musculoskeletal:     Cervical back: Neck supple. No tenderness.  Skin:    General: Skin is warm and dry.     Findings: No bruising, erythema, lesion or rash.  Neurological:     Mental Status: He is alert and oriented to person, place, and time.  Psychiatric:        Mood and Affect: Mood normal.        Behavior: Behavior normal.        Thought Content: Thought content normal.        Judgment: Judgment normal.       Assessment and Plan:  Brian Zeitlin is a 39 y.o. male presenting to the [facility] Department for STI screening  1. Screening for STD (sexually transmitted disease) Patient into clinic with symptoms. Patient declines blood draw despite counseling on benefits to know for sure if he has gotten reinfected with Syphilis. Rec condoms with all sex. Await test results.  Counseled that RN will call if needs to RTC for treatment  once results are back. - Gram stain - Gonococcus culture  2. Prophylactic measure Will treat to cover for possible Syphilis due to exam findings with Bicillin 2.4 mu IM today. No sex for 14 days and until after partners complete treatment. - penicillin g benzathine (BICILLIN LA) 1200000 UNIT/2ML injection 2.4 Million Units     No follow-ups on file.  No future appointments.  Matt Holmes, PA

## 2020-10-12 LAB — GONOCOCCUS CULTURE

## 2021-02-25 ENCOUNTER — Other Ambulatory Visit: Payer: Self-pay

## 2021-02-25 ENCOUNTER — Encounter: Payer: Self-pay | Admitting: Physician Assistant

## 2021-02-25 ENCOUNTER — Ambulatory Visit: Payer: Self-pay | Admitting: Physician Assistant

## 2021-02-25 DIAGNOSIS — A5401 Gonococcal cystitis and urethritis, unspecified: Secondary | ICD-10-CM

## 2021-02-25 DIAGNOSIS — Z113 Encounter for screening for infections with a predominantly sexual mode of transmission: Secondary | ICD-10-CM

## 2021-02-25 LAB — GRAM STAIN

## 2021-02-25 MED ORDER — DOXYCYCLINE HYCLATE 100 MG PO TABS: 100.0000 mg | ORAL_TABLET | Freq: Two times a day (BID) | ORAL | 0 refills | Status: AC

## 2021-02-25 MED ORDER — CEFTRIAXONE SODIUM 500 MG IJ SOLR
500.0000 mg | Freq: Once | INTRAMUSCULAR | Status: AC
  Administered 2021-02-25: 500 mg via INTRAMUSCULAR

## 2021-02-25 NOTE — Progress Notes (Signed)
Chart reviewed by Pharmacist  Suzanne Walker PharmD, Contract Pharmacist at [facility]  

## 2021-02-25 NOTE — Progress Notes (Signed)
[facility] Department STI clinic/screening visit  Subjective:  Glenn Horn is a 40 y.o. male being seen today for an STI screening visit. The patient reports they do have symptoms.    Patient has the following medical conditions:  There are no problems to display for this patient.    Chief Complaint  Patient presents with  . SEXUALLY TRANSMITTED DISEASE    screening    HPI  Patient reports that he has had a white discharge and "tight" feeling in his urethra for 3 days.  Denies other symptoms, surgeries and regular medicines.  States that he does not take meds for HTN.  States last HIV test was 3 years ago and last void prior to sample collection for Gram stain was about 1 hour ago.   See flowsheet for further details and programmatic requirements.    The following portions of the patient's history were reviewed and updated as appropriate: allergies, current medications, past medical history, past social history, past surgical history and problem list.  Objective:  There were no vitals filed for this visit.  Physical Exam Constitutional:      General: He is not in acute distress.    Appearance: Normal appearance.  HENT:     Head: Normocephalic and atraumatic.     Comments: No nits,lice, or hair loss. No cervical, supraclavicular or axillary adenopathy.    Mouth/Throat:     Mouth: Mucous membranes are moist.     Pharynx: Oropharynx is clear. No oropharyngeal exudate or posterior oropharyngeal erythema.  Eyes:     Conjunctiva/sclera: Conjunctivae normal.  Pulmonary:     Effort: Pulmonary effort is normal.  Abdominal:     Palpations: Abdomen is soft. There is no mass.     Tenderness: There is no abdominal tenderness. There is no guarding or rebound.  Genitourinary:    Penis: Normal.      Testes: Normal.     Comments: Pubic area without nits, lice, hair loss, edema, erythema, lesions and inguinal adenopathy. Penis uncircumcised without rash or  lesions. Small amount of yellow discharge at meatus. Musculoskeletal:     Cervical back: Neck supple. No tenderness.  Skin:    General: Skin is warm and dry.     Findings: No bruising, erythema, lesion or rash.  Neurological:     Mental Status: He is alert and oriented to person, place, and time.  Psychiatric:        Mood and Affect: Mood normal.        Behavior: Behavior normal.        Thought Content: Thought content normal.        Judgment: Judgment normal.       Assessment and Plan:  Glenn Horn is a 40 y.o. male presenting to the [facility] Department for STI screening  1. Screening for STD (sexually transmitted disease) Patient into clinic with symptoms. Patient declines blood work today. Rec condoms with all sex. Await test results.  Counseled that RN will call if needs to RTC for treatment once results are back. - Gram stain  2. Gonococcal urethritis in male Will treat for GC and cover for Chlamydia with Ceftriaxone 500 mg IM and Doxycycline 100 mg #14 1 po BID for 7 days. No sex for 14 days and until after partner/s complete treatment. Call with questions or concerns. - cefTRIAXone (ROCEPHIN) injection 500 mg - doxycycline (VIBRA-TABS) 100 MG tablet; Take 1 tablet (100 mg total) by mouth 2 (two) times daily for 7  days.  Dispense: 14 tablet; Refill: 0      No follow-ups on file.  No future appointments.  Matt Holmes, PA

## 2021-04-28 ENCOUNTER — Ambulatory Visit: Payer: Self-pay

## 2021-07-08 ENCOUNTER — Other Ambulatory Visit: Payer: Self-pay

## 2021-07-08 ENCOUNTER — Ambulatory Visit: Payer: Self-pay | Admitting: Family Medicine

## 2021-07-08 ENCOUNTER — Encounter: Payer: Self-pay | Admitting: Family Medicine

## 2021-07-08 DIAGNOSIS — A549 Gonococcal infection, unspecified: Secondary | ICD-10-CM

## 2021-07-08 DIAGNOSIS — Z113 Encounter for screening for infections with a predominantly sexual mode of transmission: Secondary | ICD-10-CM

## 2021-07-08 LAB — GRAM STAIN

## 2021-07-08 MED ORDER — CEFTRIAXONE SODIUM 500 MG IJ SOLR
500.0000 mg | Freq: Once | INTRAMUSCULAR | Status: AC
  Administered 2021-07-08: 500 mg via INTRAMUSCULAR

## 2021-07-08 MED ORDER — DOXYCYCLINE HYCLATE 100 MG PO TABS: 100.0000 mg | ORAL_TABLET | Freq: Two times a day (BID) | ORAL | 0 refills | Status: DC

## 2021-07-08 NOTE — Progress Notes (Addendum)
Pt here for STD screening.  Gram stain results reviewed.  Medication dispensed and Ceftriaxone 500 mg given IM without any complications.  Pt declined condoms.  Berdie Ogren, RN

## 2021-07-10 NOTE — Progress Notes (Signed)
   [facility] Department STI clinic/screening visit  Subjective:  Glenn Horn is a 40 y.o. male being seen today for an STI screening visit. The patient reports they do have symptoms.    Patient has the following medical conditions:  There are no problems to display for this patient.    Chief Complaint  Patient presents with   SEXUALLY TRANSMITTED DISEASE    Screening      HPI  Patient reports here for screening, dysuria x 3 days.     See flowsheet for further details and programmatic requirements.    The following portions of the patient's history were reviewed and updated as appropriate: allergies, current medications, past medical history, past social history, past surgical history and problem list.  Objective:  There were no vitals filed for this visit.  Physical Exam Constitutional:      Appearance: Normal appearance.  HENT:     Head: Normocephalic.     Mouth/Throat:     Mouth: Mucous membranes are moist.     Pharynx: Oropharynx is clear. No oropharyngeal exudate.  Genitourinary:    Penis: Normal.      Testes: Normal.     Comments: No lice, nits, or pest, no lesions.  Odorous discharge present.  Denies pain or tenderness with paplation of testicles.  No lesions, ulcers or masses present.    Musculoskeletal:     Cervical back: Normal range of motion.  Lymphadenopathy:     Cervical: No cervical adenopathy.  Skin:    General: Skin is warm and dry.     Findings: No bruising, erythema, lesion or rash.  Neurological:     Mental Status: He is alert and oriented to person, place, and time.  Psychiatric:        Mood and Affect: Mood normal.        Behavior: Behavior normal.      Assessment and Plan:  Glenn Horn is a 40 y.o. male presenting to the [facility] Department for STI screening  1. Screening examination for venereal disease  - Gonococcus culture - Gram stain - Gonococcus culture   Patient does have STI  symptoms Patient accepted all screenings including  grams stain,  oral, urine GC and declines bloodwork for HIV/RPR.  Patient meets criteria for HepB screening? Yes. Ordered? No - declines  Patient meets criteria for HepC screening? Yes. Ordered? No - declines  Recommended condom use with all sex Discussed importance of condom use for STI prevent   2. Gonorrhea  - doxycycline (VIBRA-TABS) 100 MG tablet; Take 1 tablet (100 mg total) by mouth 2 (two) times daily.  Dispense: 14 tablet; Refill: 0 - cefTRIAXone (ROCEPHIN) injection 500 mg  Treat gram stain for +gonorrhea  Discussed time line for [facility] results and that patient will be called with positive results and encouraged patient to call if he had not heard in 2 weeks Recommended returning for continued or worsening symptoms.    Return for as needed.  No future appointments.  Wendi Snipes, FNP

## 2021-07-20 NOTE — Addendum Note (Signed)
Addended by: Wendi Snipes on: 07/20/2021 02:24 PM   Modules accepted: Orders

## 2021-08-06 ENCOUNTER — Emergency Department
Admission: EM | Admit: 2021-08-06 | Discharge: 2021-08-06 | Disposition: A | Discharge status: Home / Self Care | Payer: Self-pay | Attending: Emergency Medicine | Admitting: Emergency Medicine

## 2021-08-06 ENCOUNTER — Other Ambulatory Visit: Payer: Self-pay

## 2021-08-06 DIAGNOSIS — Z202 Contact with and (suspected) exposure to infections with a predominantly sexual mode of transmission: Secondary | ICD-10-CM | POA: Insufficient documentation

## 2021-08-06 DIAGNOSIS — A549 Gonococcal infection, unspecified: Secondary | ICD-10-CM | POA: Insufficient documentation

## 2021-08-06 DIAGNOSIS — F17210 Nicotine dependence, cigarettes, uncomplicated: Secondary | ICD-10-CM | POA: Insufficient documentation

## 2021-08-06 DIAGNOSIS — I10 Essential (primary) hypertension: Secondary | ICD-10-CM | POA: Insufficient documentation

## 2021-08-06 LAB — URINALYSIS, COMPLETE (UACMP) WITH MICROSCOPIC
Bilirubin Urine: NEGATIVE
Glucose, UA: NEGATIVE mg/dL
Ketones, ur: 5 mg/dL — AB
Nitrite: NEGATIVE
Protein, ur: 30 mg/dL — AB
Specific Gravity, Urine: 1.029 (ref 1.005–1.030)
WBC, UA: >50 WBC/hpf — ABNORMAL HIGH (ref 0–5)
pH: 5 (ref 5.0–8.0)

## 2021-08-06 LAB — CHLAMYDIA/NGC RT PCR (ARMC ONLY)
Chlamydia Tr: NOT DETECTED
N gonorrhoeae: DETECTED — AB

## 2021-08-06 MED ORDER — CEFTRIAXONE SODIUM 1 G IJ SOLR
500.0000 mg | Freq: Once | INTRAMUSCULAR | Status: AC
  Administered 2021-08-06: 500 mg via INTRAMUSCULAR
  Filled 2021-08-06: qty 10

## 2021-08-06 NOTE — ED Notes (Signed)
Patient attempting 2nd urine at this time. Lab needs "dirty" catch for GC chlamydia sample

## 2021-08-06 NOTE — Discharge Instructions (Addendum)
Follow discharge care instruction has sexual partner tested and treated as needed.  Follow-up with [facility] department for further evaluation as needed.

## 2021-08-06 NOTE — ED Provider Notes (Signed)
[facility] Emergency Department Provider Note   ____________________________________________   Event Date/Time   First MD Initiated Contact with Patient 08/06/21 1241     (approximate)  I have reviewed the triage vital signs and the nursing notes.   HISTORY  Chief Complaint Exposure to STD    HPI Glenn Horn is a 40 y.o. male patient complain of urethral discharge and dysuria for 3 days.  Patient last sexual contact approximately 10 days ago.  Denies fever/ chills associated with complaint.         Past Medical History:  Diagnosis Date   Hypertension     There are no problems to display for this patient.   History reviewed. No pertinent surgical history.  Prior to Admission medications   Medication Sig Start Date End Date Taking? Authorizing Provider  acyclovir (ZOVIRAX) 400 MG tablet Take 1 tablet (400 mg total) by mouth 3 (three) times daily. Patient taking for episodic therapy.  Take 1 po TID for 5 days prn. 08/11/20   Matt Holmes, PA  doxycycline (VIBRA-TABS) 100 MG tablet Take 1 tablet (100 mg total) by mouth 2 (two) times daily. 07/08/21   Wendi Snipes, FNP  hydrochlorothiazide (HYDRODIURIL) 25 MG tablet Take 1 tablet (25 mg total) by mouth daily. Patient not taking: Reported on 07/08/2021 10/08/18   Nita Sickle, MD  meloxicam (MOBIC) 15 MG tablet Take 1 tablet (15 mg total) by mouth daily. Patient not taking: Reported on 07/08/2021 10/14/18   Faythe Ghee, PA-C    Allergies Codeine  No family history on file.  Social History Social History   Tobacco Use   Smoking status: Every Day    Packs/day: 0.50    Years: 20.00    Pack years: 10.00    Types: Cigarettes   Smokeless tobacco: Never  Vaping Use   Vaping Use: Never used  Substance Use Topics   Alcohol use: Yes    Alcohol/week: 4.0 standard drinks    Types: 2 Cans of beer, 2 Shots of liquor per week    Comment: daily   Drug use: Yes    Types:  Marijuana, Cocaine    Comment: occasionally    Review of Systems Constitutional: No fever/chills Eyes: No visual changes. ENT: No sore throat. Cardiovascular: Denies chest pain. Respiratory: Denies shortness of breath. Gastrointestinal: No abdominal pain.  No nausea, no vomiting.  No diarrhea.  No constipation. Genitourinary: Positive or dysuria urethral discharge. Musculoskeletal: Negative for back pain. Skin: Negative for rash. Neurological: Negative for headaches, focal weakness or numbness.  Endocrine: Hypertension Allergic/Immunilogical: Codeine ____________________________________________   PHYSICAL EXAM:  VITAL SIGNS: ED Triage Vitals  Enc Vitals Group     BP 08/06/21 1216 (!) 164/98     Pulse Rate 08/06/21 1216 100     Resp 08/06/21 1216 17     Temp 08/06/21 1216 98.5 F (36.9 C)     Temp Source 08/06/21 1216 Oral     SpO2 08/06/21 1216 99 %     Weight 08/06/21 1217 204 lb (92.5 kg)     Height 08/06/21 1217 6' (1.829 m)     Head Circumference --      Peak Flow --      Pain Score 08/06/21 1217 5     Pain Loc --      Pain Edu? --      Excl. in GC? --    Constitutional: Alert and oriented. Well appearing and in no acute distress. Hematological/Lymphatic/Immunilogical: No cervical  lymphadenopathy. Cardiovascular: Normal rate, regular rhythm. Grossly normal heart sounds.  Good peripheral circulation.  Elevated blood pressure. Respiratory: Normal respiratory effort.  No retractions. Lungs CTAB. Gastrointestinal: Soft and nontender. No distention. No abdominal bruits. No CVA tenderness. Genitourinary: No obvious penile lesion or urethral discharge. Neurologic:  Normal speech and language. No gross focal neurologic deficits are appreciated. No gait instability. Skin:  Skin is warm, dry and intact. No rash noted. Psychiatric: Mood and affect are normal. Speech and behavior are normal.  ____________________________________________   LABS (all labs ordered are  listed, but only abnormal results are displayed)  Labs Reviewed  CHLAMYDIA/NGC RT PCR ([facility] ONLY)           - Abnormal; Notable for the following components:      Result Value   N gonorrhoeae DETECTED (*)    All other components within normal limits  URINALYSIS, COMPLETE (UACMP) WITH MICROSCOPIC - Abnormal; Notable for the following components:   Color, Urine AMBER (*)    APPearance CLOUDY (*)    Hgb urine dipstick SMALL (*)    Ketones, ur 5 (*)    Protein, ur 30 (*)    Leukocytes,Ua LARGE (*)    WBC, UA >50 (*)    Bacteria, UA RARE (*)    All other components within normal limits   ____________________________________________  EKG   ____________________________________________  RADIOLOGY I, Joni Reining, personally viewed and evaluated these images (plain radiographs) as part of my medical decision making, as well as reviewing the written report by the radiologist.  ED MD interpretation:    Official radiology report(s): No results found.  ____________________________________________   PROCEDURES  Procedure(s) performed (including Critical Care):  Procedures   ____________________________________________   INITIAL IMPRESSION / ASSESSMENT AND PLAN / ED COURSE  As part of my medical decision making, I reviewed the following data within the electronic MEDICAL RECORD NUMBER         Patient presents with dysuria and urethral discharge for 3 days.  Patient test positive for gonorrhea.  Patient is negative for chlamydia.  Patient given Rocephin 500 mg IM and advised to follow up with the [facility] department.      ____________________________________________   FINAL CLINICAL IMPRESSION(S) / ED DIAGNOSES  Final diagnoses:  Gonorrhea     ED Discharge Orders     None        Note:  This document was prepared using Dragon voice recognition software and may include unintentional dictation errors.    Joni Reining, PA-C 08/06/21 1518     Delton Prairie, MD 08/06/21 1540

## 2021-08-06 NOTE — ED Triage Notes (Signed)
Pt to ER via Pov with complaints of possible STD. Reports being exposed to Gonorrhea, now has been having penile discharge for the past 6-10 days.

## 2021-08-23 ENCOUNTER — Ambulatory Visit: Payer: Self-pay | Admitting: Physician Assistant

## 2021-08-23 ENCOUNTER — Encounter: Payer: Self-pay | Admitting: Physician Assistant

## 2021-08-23 ENCOUNTER — Other Ambulatory Visit: Payer: Self-pay

## 2021-08-23 DIAGNOSIS — Z299 Encounter for prophylactic measures, unspecified: Secondary | ICD-10-CM

## 2021-08-23 DIAGNOSIS — Z113 Encounter for screening for infections with a predominantly sexual mode of transmission: Secondary | ICD-10-CM

## 2021-08-23 LAB — GRAM STAIN

## 2021-08-23 MED ORDER — DOXYCYCLINE HYCLATE 100 MG PO TABS: 100.0000 mg | ORAL_TABLET | Freq: Two times a day (BID) | ORAL | 0 refills | Status: AC

## 2021-08-23 NOTE — Progress Notes (Signed)
[facility] Department STI clinic/screening visit  Subjective:  Glenn Horn is a 40 y.o. male being seen today for an STI screening visit. The patient reports they do have symptoms.    Patient has the following medical conditions:  There are no problems to display for this patient.    Chief Complaint  Patient presents with   SEXUALLY TRANSMITTED DISEASE    screening    HPI  Patient reports that he has had some testicular pain since yesterday.  Denies other symptoms,chronic conditions, surgeries and regular medicines.  Reports last HIV test was 2 years ago and last void prior to sample collection for Gram stain was 1 hour ago.   Screening for MPX risk: Does the patient have an unexplained rash? No Is the patient MSM? No Does the patient endorse multiple sex partners or anonymous sex partners? No Did the patient have close or sexual contact with a person diagnosed with MPX? No Has the patient traveled outside the [place] where MPX is endemic? No Is there a high clinical suspicion for MPX-- evidenced by one of the following No  -Unlikely to be chickenpox  -Lymphadenopathy  -Rash that present in same phase of evolution on any given body part   See flowsheet for further details and programmatic requirements.    The following portions of the patient's history were reviewed and updated as appropriate: allergies, current medications, past medical history, past social history, past surgical history and problem list.  Objective:  There were no vitals filed for this visit.  Physical Exam Constitutional:      General: He is not in acute distress.    Appearance: Normal appearance.  HENT:     Head: Normocephalic and atraumatic.     Comments: No nits,lice, or hair loss. No cervical, supraclavicular or axillary adenopathy.     Mouth/Throat:     Mouth: Mucous membranes are moist.     Pharynx: Oropharynx is clear. No oropharyngeal exudate or posterior oropharyngeal  erythema.  Eyes:     Conjunctiva/sclera: Conjunctivae normal.  Pulmonary:     Effort: Pulmonary effort is normal.  Abdominal:     Palpations: Abdomen is soft. There is no mass.     Tenderness: There is no abdominal tenderness. There is no guarding or rebound.  Genitourinary:    Penis: Normal.      Testes: Normal.     Comments: Pubic area without nits, lice, hair loss, edema, erythema, lesions and inguinal adenopathy. Penis uncircumcised without rash, lesions and discharge at meatus. Testicles descended bilaterally,nt, no masses or edema.  Musculoskeletal:     Cervical back: Neck supple. No tenderness.  Skin:    General: Skin is warm and dry.     Findings: No bruising, erythema, lesion or rash.  Neurological:     Mental Status: He is alert and oriented to person, place, and time.  Psychiatric:        Mood and Affect: Mood normal.        Behavior: Behavior normal.        Thought Content: Thought content normal.        Judgment: Judgment normal.      Assessment and Plan:  Glenn Horn is a 40 y.o. male presenting to the [facility] Department for STI screening  1. Screening for STD (sexually transmitted disease) Patient into clinic without symptoms. Patient declines blood work today. Reviewed with patient Gram stain results. Rec condoms with all sex. Await test results.  Counseled that RN  will call if needs to RTC for treatment once results are back.  - Gram stain - Gonococcus culture  2. Prophylactic measure Due to patient risk and symptoms will treat with Doxycycline 100 mg #14 1 po BID for 7 days. No sex for 14 days and until after partner completes treatment. Call with questions or concerns.  - doxycycline (VIBRA-TABS) 100 MG tablet; Take 1 tablet (100 mg total) by mouth 2 (two) times daily for 7 days.  Dispense: 14 tablet; Refill: 0     No follow-ups on file.  No future appointments.  Matt Holmes, PA

## 2021-08-27 LAB — GONOCOCCUS CULTURE

## 2021-08-28 NOTE — Progress Notes (Signed)
Chart reviewed by Pharmacist  Suzanne Walker PharmD, Contract Pharmacist at [facility]  

## 2021-10-05 ENCOUNTER — Emergency Department: Payer: Self-pay

## 2021-10-05 ENCOUNTER — Other Ambulatory Visit: Payer: Self-pay

## 2021-10-05 ENCOUNTER — Encounter: Payer: Self-pay | Admitting: Emergency Medicine

## 2021-10-05 ENCOUNTER — Emergency Department
Admission: EM | Admit: 2021-10-05 | Discharge: 2021-10-05 | Disposition: A | Discharge status: Home / Self Care | Payer: Self-pay | Attending: Emergency Medicine | Admitting: Emergency Medicine

## 2021-10-05 DIAGNOSIS — I10 Essential (primary) hypertension: Secondary | ICD-10-CM | POA: Insufficient documentation

## 2021-10-05 DIAGNOSIS — F17210 Nicotine dependence, cigarettes, uncomplicated: Secondary | ICD-10-CM | POA: Insufficient documentation

## 2021-10-05 DIAGNOSIS — S63642A Sprain of metacarpophalangeal joint of left thumb, initial encounter: Secondary | ICD-10-CM | POA: Insufficient documentation

## 2021-10-05 DIAGNOSIS — Z79899 Other long term (current) drug therapy: Secondary | ICD-10-CM | POA: Insufficient documentation

## 2021-10-05 DIAGNOSIS — X58XXXA Exposure to other specified factors, initial encounter: Secondary | ICD-10-CM | POA: Insufficient documentation

## 2021-10-05 MED ORDER — MELOXICAM 15 MG PO TABS: 15.0000 mg | ORAL_TABLET | Freq: Every day | ORAL | 0 refills | Status: AC

## 2021-10-05 NOTE — ED Provider Notes (Signed)
[facility] Emergency Department Provider Note ____________________________________________  Time seen: Approximately 3:32 PM  I have reviewed the triage vital signs and the nursing notes.   HISTORY  Chief Complaint Finger Injury    HPI Glenn Horn is a 40 y.o. male who presents to the emergency department for evaluation and treatment of left thumb pain without specific injury. Symptoms started earlier today. No alleviating measures.   Past Medical History:  Diagnosis Date   Hypertension     There are no problems to display for this patient.   History reviewed. No pertinent surgical history.  Prior to Admission medications   Medication Sig Start Date End Date Taking? Authorizing Provider  meloxicam (MOBIC) 15 MG tablet Take 1 tablet (15 mg total) by mouth daily. 10/05/21  Yes Ouita Nish B, FNP  acyclovir (ZOVIRAX) 400 MG tablet Take 1 tablet (400 mg total) by mouth 3 (three) times daily. Patient taking for episodic therapy.  Take 1 po TID for 5 days prn. 08/11/20   Matt Holmes, PA  hydrochlorothiazide (HYDRODIURIL) 25 MG tablet Take 1 tablet (25 mg total) by mouth daily. Patient not taking: Reported on 07/08/2021 10/08/18   Nita Sickle, MD    Allergies Codeine  History reviewed. No pertinent family history.  Social History Social History   Tobacco Use   Smoking status: Every Day    Packs/day: 0.50    Years: 20.00    Pack years: 10.00    Types: Cigarettes   Smokeless tobacco: Never  Vaping Use   Vaping Use: Never used  Substance Use Topics   Alcohol use: Yes    Alcohol/week: 4.0 standard drinks    Types: 2 Cans of beer, 2 Shots of liquor per week    Comment: daily   Drug use: Yes    Types: Marijuana, Cocaine    Comment: occasionally    Review of Systems Constitutional: Negative for fever. Cardiovascular: Negative for chest pain. Respiratory: Negative for shortness of breath. Musculoskeletal: Positive for left  thumb pain. Skin: Negative for open wounds or lesions Neurological: Negative for decrease in sensation  ____________________________________________   PHYSICAL EXAM:  VITAL SIGNS: ED Triage Vitals  Enc Vitals Group     BP 10/05/21 1009 (!) 179/118     Pulse Rate 10/05/21 1009 (!) 117     Resp 10/05/21 1009 18     Temp 10/05/21 1009 98.7 F (37.1 C)     Temp Source 10/05/21 1009 Oral     SpO2 10/05/21 1009 91 %     Weight 10/05/21 1005 203 lb 14.8 oz (92.5 kg)     Height 10/05/21 1005 6' (1.829 m)     Head Circumference --      Peak Flow --      Pain Score 10/05/21 1005 10     Pain Loc --      Pain Edu? --      Excl. in GC? --     Constitutional: Alert and oriented. Well appearing and in no acute distress. Eyes: Conjunctivae are clear without discharge or drainage Head: Atraumatic Neck: Supple Respiratory: No cough. Respirations are even and unlabored. Musculoskeletal: Patient able to demonstrate full range of motion of the left thumb.  There is very mild edema at the MCP. Neurologic: Motor and sensory function intact Skin: Intact Psychiatric: Affect and behavior are appropriate.  ____________________________________________   LABS (all labs ordered are listed, but only abnormal results are displayed)  Labs Reviewed - No data to display ____________________________________________  RADIOLOGY  Image of the left thumb negative for acute concerns.  I, Kem Boroughs, personally viewed and evaluated these images (plain radiographs) as part of my medical decision making, as well as reviewing the written report by the radiologist.  DG Finger Thumb Left  Result Date: 10/05/2021 CLINICAL DATA:  Left thumb pain and swelling without known injury. EXAM: LEFT THUMB 2+V COMPARISON:  None. FINDINGS: There is no evidence of fracture or dislocation. There is no evidence of arthropathy or other focal bone abnormality. Soft tissues are unremarkable. IMPRESSION: Negative.  Electronically Signed   By: Lupita Raider M.D.   On: 10/05/2021 10:58   ____________________________________________   PROCEDURES  Procedures  ____________________________________________   INITIAL IMPRESSION / ASSESSMENT AND PLAN / ED COURSE  Glenn Horn is a 40 y.o. who presents to the emergency department for evaluation of nontraumatic left thumb pain.  X-ray is negative for acute bony abnormality or retained radiopaque foreign body.  Was placed in an aluminum foam static finger splint and given a prescription for meloxicam.  Medications - No data to display  Pertinent labs & imaging results that were available during my care of the patient were reviewed by me and considered in my medical decision making (see chart for details).   _________________________________________   FINAL CLINICAL IMPRESSION(S) / ED DIAGNOSES  Final diagnoses:  Sprain of metacarpophalangeal (MCP) joint of left thumb, initial encounter  Hypertension, unspecified type    ED Discharge Orders          Ordered    meloxicam (MOBIC) 15 MG tablet  Daily        10/05/21 1104             If controlled substance prescribed during this visit, 12 month history viewed on the [facility] prior to issuing an initial prescription for Schedule II or III opiod.    Chinita Pester, FNP 10/05/21 1534    Dionne Bucy, MD 10/05/21 1614

## 2021-10-05 NOTE — ED Triage Notes (Signed)
Pt comes into the ED via POV c/o left thumb pain.  Pt denies any known injury, but states he pushed up off the bed, and it started hurting.  Pt in NAD at this time but does have swelling present in the thumb.

## 2021-10-05 NOTE — ED Notes (Signed)
Pt appears to be under the influence of a substance at this time. Pt is unable to keep eye contact and is talking with quick speech that is unable to be understood at all times.

## 2021-10-05 NOTE — ED Notes (Signed)
Left thumb splinted by this RN per provider request.

## 2021-10-05 NOTE — Discharge Instructions (Signed)
Your blood pressure is high today. Please follow up with primary care to recheck it.

## 2022-01-05 ENCOUNTER — Ambulatory Visit: Payer: Self-pay

## 2022-11-15 ENCOUNTER — Encounter: Payer: Self-pay | Admitting: Emergency Medicine

## 2022-11-15 ENCOUNTER — Emergency Department
Admission: EM | Admit: 2022-11-15 | Discharge: 2022-11-15 | Disposition: A | Discharge status: Home / Self Care | Payer: Self-pay | Attending: Student in an Organized Health Care Education/Training Program | Admitting: Student in an Organized Health Care Education/Training Program

## 2022-11-15 DIAGNOSIS — I10 Essential (primary) hypertension: Secondary | ICD-10-CM | POA: Insufficient documentation

## 2022-11-15 DIAGNOSIS — R369 Urethral discharge, unspecified: Secondary | ICD-10-CM | POA: Insufficient documentation

## 2022-11-15 LAB — CHLAMYDIA/NGC RT PCR (ARMC ONLY)
Chlamydia Tr: NOT DETECTED
N gonorrhoeae: DETECTED — AB

## 2022-11-15 LAB — URINALYSIS, ROUTINE W REFLEX MICROSCOPIC
Bilirubin Urine: NEGATIVE
Glucose, UA: NEGATIVE mg/dL
Ketones, ur: NEGATIVE mg/dL
Nitrite: NEGATIVE
Protein, ur: 30 mg/dL — AB
Specific Gravity, Urine: 1.021 (ref 1.005–1.030)
WBC, UA: >50 WBC/hpf — ABNORMAL HIGH (ref 0–5)
pH: 6 (ref 5.0–8.0)

## 2022-11-15 MED ORDER — CEFTRIAXONE SODIUM 1 G IJ SOLR
500.0000 mg | Freq: Once | INTRAMUSCULAR | Status: AC
  Administered 2022-11-15: 500 mg via INTRAMUSCULAR
  Filled 2022-11-15: qty 10

## 2022-11-15 MED ORDER — DOXYCYCLINE MONOHYDRATE 100 MG PO TABS: 100.0000 mg | ORAL_TABLET | Freq: Two times a day (BID) | ORAL | 0 refills | Status: AC

## 2022-11-15 NOTE — ED Triage Notes (Signed)
Pt with c/o dysuria and white penile discharge x2 days after having unprotected sexual intercourse with new partner. Pt denies fever and denies abdominal pain.

## 2022-11-15 NOTE — Discharge Instructions (Addendum)
-  You may review your results online.  If you are negative for chlamydia, you do not need to take the doxycycline.  -For future sexual encounters, I recommend barrier protection, such as condoms.  Reviewed the educational material provided.  -If your symptoms persist despite antibiotics, you may return here or follow-up with your primary care provider.  -Return to the emergency department anytime if you begin to experience any new or worsening symptoms.

## 2022-11-15 NOTE — ED Provider Notes (Signed)
[facility] Provider Note    Event Date/Time   First MD Initiated Contact with Patient 11/15/22 1923     (approximate)   History   Chief Complaint Dysuria   HPI Glenn Horn is a 41 y.o. male, history of hypertension, presents to the emergency department for evaluation of penile discharge.  Patient states that he began having a burning sensation with urination as well as white penile discharge approximately 2 days ago.  He states that he has recently had unprotected sexual intercourse with a new partner.  He states he has had both gonorrhea and chlamydia in the past, and states that this feels very similar.  He does not want testing for any other STDs at this time.  Denies fever/chills, myalgias, chest pain, shortness of breath, abdominal pain, groin pain, testicular pain, nausea/vomiting, diarrhea, vision changes, hearing changes, hematuria, weakness, or dizziness/lightheadedness.  History Limitations: No limitations.        Physical Exam  Triage Vital Signs: ED Triage Vitals [11/15/22 1907]  Enc Vitals Group     BP (!) 158/99     Pulse Rate 78     Resp 16     Temp 98.7 F (37.1 C)     Temp Source Oral     SpO2 98 %     Weight      Height      Head Circumference      Peak Flow      Pain Score      Pain Loc      Pain Edu?      Excl. in GC?     Most recent vital signs: Vitals:   11/15/22 1907  BP: (!) 158/99  Pulse: 78  Resp: 16  Temp: 98.7 F (37.1 C)  SpO2: 98%    General: Awake, NAD.  Skin: Warm, dry. No rashes or lesions.  Eyes: PERRL. Conjunctivae normal.  CV: Good peripheral perfusion.  Resp: Normal effort.  Abd: Soft, non-tender. No distention.  Neuro: At baseline. No gross neurological deficits.  Musculoskeletal: Normal ROM of all extremities.  Physical Exam    ED Results / Procedures / Treatments  Labs (all labs ordered are listed, but only abnormal results are displayed) Labs Reviewed  URINALYSIS, ROUTINE W  REFLEX MICROSCOPIC - Abnormal; Notable for the following components:      Result Value   Color, Urine YELLOW (*)    APPearance CLOUDY (*)    Hgb urine dipstick SMALL (*)    Protein, ur 30 (*)    Leukocytes,Ua LARGE (*)    WBC, UA >50 (*)    Bacteria, UA RARE (*)    All other components within normal limits  CHLAMYDIA/NGC RT PCR ([facility] ONLY)               EKG N/A.    RADIOLOGY  ED Provider Interpretation: N/A.  No results found.  PROCEDURES:  Critical Care performed: N/A.  Procedures    MEDICATIONS ORDERED IN ED: Medications  cefTRIAXone (ROCEPHIN) injection 500 mg (has no administration in time range)     IMPRESSION / MDM / ASSESSMENT AND PLAN / ED COURSE  I reviewed the triage vital signs and the nursing notes.                              Differential diagnosis includes, but is not limited to, chlamydia infection, gonorrhea infection, syphilis, HIV, urinary tract infection  Assessment/Plan Patient  presents with dysuria/penile discharge x2 days.  Highly suspicious for STI given his admission of recent unprotected sexual intercourse with a new partner.  Urinalysis shows significant mount of leukocytes and bacteria, consistent with infection.  Pending gonorrhea/chlamydia at this time, however patient states he does not want a wait for results.  He additionally stated that he does not want any additional STI testing either.  We will treat empirically with IM ceftriaxone and provide with a prescription for doxycycline.  Advised him that if he is negative for chlamydia on his results when he reviews results at home, he does not need to take the doxycycline.  Patient expressed understanding.  Recommend they follow-up with his primary care provider as needed.  Will discharge.   Provided the patient with anticipatory guidance, return precautions, and educational material. Encouraged the patient to return to the emergency department at any time if they begin to experience any  new or worsening symptoms. Patient expressed understanding and agreed with the plan.   Patient's presentation is most consistent with acute complicated illness / injury requiring diagnostic workup.       FINAL CLINICAL IMPRESSION(S) / ED DIAGNOSES   Final diagnoses:  Urethral discharge in male     Rx / DC Orders   ED Discharge Orders          Ordered    doxycycline (ADOXA) 100 MG tablet  2 times daily        11/15/22 1938             Note:  This document was prepared using Dragon voice recognition software and may include unintentional dictation errors.   Varney Daily, [place] 11/15/22 1950    Willy Eddy, MD 11/15/22 2003

## 2022-11-20 ENCOUNTER — Telehealth: Payer: Self-pay | Admitting: Emergency Medicine

## 2022-11-20 NOTE — Telephone Encounter (Signed)
Called patient to inform of std results.  He was treated during ED visit.  Left message.

## 2022-11-21 NOTE — Telephone Encounter (Signed)
Called again regarding std test results.  No answer and vm box is full

## 2022-12-04 ENCOUNTER — Ambulatory Visit: Payer: Self-pay | Admitting: Family Medicine

## 2023-03-09 ENCOUNTER — Encounter: Payer: Self-pay | Admitting: Family

## 2023-03-09 ENCOUNTER — Ambulatory Visit: Payer: Self-pay | Admitting: Family

## 2023-03-09 DIAGNOSIS — Z113 Encounter for screening for infections with a predominantly sexual mode of transmission: Secondary | ICD-10-CM

## 2023-03-09 DIAGNOSIS — A6001 Herpesviral infection of penis: Secondary | ICD-10-CM

## 2023-03-09 LAB — HM HIV SCREENING LAB: HM HIV Screening: NEGATIVE

## 2023-03-09 LAB — HM HEPATITIS C SCREENING LAB: HM Hepatitis Screen: NEGATIVE

## 2023-03-09 MED ORDER — ACYCLOVIR 800 MG PO TABS: 800.0000 mg | ORAL_TABLET | Freq: Two times a day (BID) | ORAL | 6 refills | Status: AC

## 2023-03-09 NOTE — Progress Notes (Signed)
[facility] Department STI clinic/screening visit  Subjective:  Glenn Horn is a 42 y.o. male being seen today for an STI screening visit. The patient reports they do have symptoms.    Patient has the following medical conditions:  There are no problems to display for this patient.    Chief Complaint  Patient presents with   SEXUALLY TRANSMITTED DISEASE    "Burning in penis" x 4    HPI  Patient reports intermittent penile burning 3-4 times in the past week. Last sex 2 weeks ago without condom, has 2 partners in last 2 months.    Last HIV test per patient/review of record was  Lab Results  Component Value Date   HMHIVSCREEN Negative - Validated 12/09/2019   No results found for: "HIV"  Does the patient or their partner desires a pregnancy in the next year? No  Screening for MPX risk: Does the patient have an unexplained rash? No Is the patient MSM? No Does the patient endorse multiple sex partners or anonymous sex partners? No Did the patient have close or sexual contact with a person diagnosed with MPX? No Has the patient traveled outside the [place] where MPX is endemic? No Is there a high clinical suspicion for MPX-- evidenced by one of the following No  -Unlikely to be chickenpox  -Lymphadenopathy  -Rash that present in same phase of evolution on any given body part   See flowsheet for further details and programmatic requirements.    There is no immunization history on file for this patient.   The following portions of the patient's history were reviewed and updated as appropriate: allergies, current medications, past medical history, past social history, past surgical history and problem list.  Objective:  There were no vitals filed for this visit.  Physical Exam Nursing note reviewed.  Constitutional:      Appearance: Normal appearance.  HENT:     Mouth/Throat:     Mouth: Mucous membranes are moist.     Pharynx: Oropharynx is clear. No  oropharyngeal exudate or posterior oropharyngeal erythema.  Abdominal:     Hernia: There is no hernia in the left inguinal area or right inguinal area.  Genitourinary:    Pubic Area: No rash.      Penis: Circumcised. No erythema, tenderness, discharge, swelling or lesions.      Testes:        Right: Mass, tenderness or swelling not present.        Left: Mass, tenderness or swelling not present.     Rectum: Normal.  Lymphadenopathy:     Cervical: No cervical adenopathy.     Upper Body:     Right upper body: No axillary or epitrochlear adenopathy.     Left upper body: No supraclavicular, axillary or epitrochlear adenopathy.     Lower Body: No right inguinal adenopathy. No left inguinal adenopathy.  Skin:    General: Skin is warm and dry.  Neurological:     Mental Status: He is alert and oriented to person, place, and time.  Psychiatric:        Mood and Affect: Mood normal.        Behavior: Behavior normal.   Unable to collect sample for gram stain, patient will provide a urine sample for testing.     Assessment and Plan:  Glenn Horn is a 42 y.o. male presenting to the [facility] Department for STI screening  1. Screening for venereal disease Will contact with positive results Use  condoms for all sex  - Chlamydia/GC NAA, Confirmation - HIV/HCV [facility] - Syphilis Serology, [facility]   Patient does not have STI symptoms Patient accepted all screenings including  urine GC/Chlamydia, and blood work for HIV/Syphilis. Patient meets criteria for HepB screening? No. Ordered? no Patient meets criteria for HepC screening? No. Ordered? no Recommended condom use with all sex Discussed importance of condom use for STI prevent  Treat positive test results per standing order. Discussed time line for State Lab results and that patient will be called with positive results and encouraged patient to call if he had not heard in 2 weeks Recommended repeat testing in 3  months with positive results. Recommended returning for continued or worsening symptoms.   Return if symptoms worsen or fail to improve.  No future appointments.  Marline Backbone, FNP

## 2023-03-09 NOTE — Progress Notes (Addendum)
Patient requesting refill on Acyclovir for episodic treatment of herpes. Rx for  Acyclovir 800mg  PO BID x 5 days, #10 with 12 refills sent to [facility]. Dominica Severin, FNP-C

## 2023-03-09 NOTE — Addendum Note (Signed)
Addended by: Bradd Burner A on: 03/09/2023 01:44 PM   Modules accepted: Orders

## 2023-03-13 LAB — CHLAMYDIA/GC NAA, CONFIRMATION
Chlamydia trachomatis, NAA: NEGATIVE
Neisseria gonorrhoeae, NAA: NEGATIVE

## 2023-03-19 ENCOUNTER — Ambulatory Visit: Payer: Self-pay

## 2023-04-24 ENCOUNTER — Encounter: Payer: Self-pay | Admitting: Emergency Medicine

## 2023-04-24 ENCOUNTER — Other Ambulatory Visit: Payer: Self-pay

## 2023-04-24 DIAGNOSIS — Z5321 Procedure and treatment not carried out due to patient leaving prior to being seen by health care provider: Secondary | ICD-10-CM | POA: Insufficient documentation

## 2023-04-24 DIAGNOSIS — M79605 Pain in left leg: Secondary | ICD-10-CM | POA: Insufficient documentation

## 2023-04-24 DIAGNOSIS — M7989 Other specified soft tissue disorders: Secondary | ICD-10-CM | POA: Insufficient documentation

## 2023-04-24 NOTE — ED Triage Notes (Signed)
Patient ambulatory to triage with steady gait, without difficulty or distress noted; pt reports x 5 days having pain and swelling to left leg; denies any injury, denies accomp symptoms; st hx DVT several yrs ago and never took anticoagulant due to cost

## 2023-04-25 ENCOUNTER — Telehealth: Payer: Self-pay | Admitting: Emergency Medicine

## 2023-04-25 ENCOUNTER — Emergency Department
Admission: EM | Admit: 2023-04-25 | Discharge: 2023-04-25 | Discharge status: Against Medical Advice | Payer: Self-pay | Attending: Emergency Medicine | Admitting: Emergency Medicine

## 2023-04-25 ENCOUNTER — Emergency Department: Payer: Self-pay

## 2023-04-25 HISTORY — DX: Acute embolism and thrombosis of unspecified deep veins of unspecified lower extremity: I82.409

## 2023-04-25 NOTE — Telephone Encounter (Signed)
Called patient due to left emergency department before provider exam to inquire about condition and follow up plans.  I explained that there was a clot on the ultrasound and that he really needs to be seen.  He does not have a pcp, and he does not have insurance.  He says he will return here this evening after work.  I also told him to let the doctor know that he does not have insurance.  (He never took anticoags last DVT because of $$$.)

## 2023-04-25 NOTE — ED Notes (Signed)
No answer when called several times from lobby 

## 2023-09-07 ENCOUNTER — Telehealth: Payer: Self-pay | Admitting: Family Medicine

## 2023-09-07 ENCOUNTER — Other Ambulatory Visit: Payer: Self-pay | Admitting: Family Medicine

## 2023-09-07 DIAGNOSIS — B009 Herpesviral infection, unspecified: Secondary | ICD-10-CM

## 2023-09-07 MED ORDER — ACYCLOVIR 400 MG PO TABS: 400.0000 mg | ORAL_TABLET | Freq: Three times a day (TID) | ORAL | 5 refills | Status: AC

## 2023-09-07 NOTE — Telephone Encounter (Signed)
Pt notified that a new prescription for Acyclovir has been sent to his pharmacy.   Berdie Ogren, RN

## 2023-09-07 NOTE — Telephone Encounter (Signed)
Patient called about a prescription that needs to be send to [facility].

## 2023-11-25 ENCOUNTER — Other Ambulatory Visit: Payer: Self-pay

## 2023-11-25 ENCOUNTER — Emergency Department: Payer: 59

## 2023-11-25 ENCOUNTER — Emergency Department
Admission: EM | Admit: 2023-11-25 | Discharge: 2023-11-25 | Disposition: A | Discharge status: Home / Self Care | Payer: Self-pay | Attending: Emergency Medicine | Admitting: Emergency Medicine

## 2023-11-25 ENCOUNTER — Emergency Department: Payer: Self-pay

## 2023-11-25 DIAGNOSIS — M25531 Pain in right wrist: Secondary | ICD-10-CM | POA: Diagnosis not present

## 2023-11-25 DIAGNOSIS — M79601 Pain in right arm: Secondary | ICD-10-CM | POA: Insufficient documentation

## 2023-11-25 DIAGNOSIS — I10 Essential (primary) hypertension: Secondary | ICD-10-CM | POA: Diagnosis not present

## 2023-11-25 MED ORDER — TRAMADOL HCL 50 MG PO TABS: 50.0000 mg | ORAL_TABLET | Freq: Four times a day (QID) | ORAL | 0 refills | Status: AC | PRN

## 2023-11-25 MED ORDER — AMLODIPINE BESYLATE 5 MG PO TABS: 5.0000 mg | ORAL_TABLET | Freq: Every day | ORAL | 5 refills | Status: AC

## 2023-11-25 NOTE — ED Triage Notes (Addendum)
Pt to ED via POV from home. Pt reports pain to right arm that started Thursday. Pt denies injury. Pt has deformity to right wrist. Pt also reports right humerus/should pain. Pt reports drinking almost everyday. Pt reports hx of HTN. Pt denies BP meds. Right radial pulse palpable. Pt reports +sensation but states tingling to right arm. Pt is on blood thinner from prior DVT

## 2023-11-25 NOTE — ED Provider Notes (Signed)
[facility] Provider Note    Event Date/Time   First MD Initiated Contact with Patient 11/25/23 1808     (approximate)  History   Chief Complaint: Arm Pain  HPI  Glenn Horn is a 42 y.o. male with a past medical history of DVT, hypertension, presents to the emergency department for right arm pain.  According to the patient for the past 3 to 4 days he has been experiencing pain in the right arm.  Patient denies any trauma to the arm.  Patient does admit to daily alcohol use but states no chance that he injured it.  Patient was noted to have a deformity to the right wrist however he has a similar deformity to the left wrist.  Does have some mild swelling to the right wrist which he states is new.  States pain up and down the right arm but not specifically in any joint.  Physical Exam   Triage Vital Signs: ED Triage Vitals  Encounter Vitals Group     BP 11/25/23 1736 (!) 157/128     Systolic BP Percentile --      Diastolic BP Percentile --      Pulse Rate 11/25/23 1736 (!) 119     Resp 11/25/23 1736 20     Temp 11/25/23 1736 97.9 F (36.6 C)     Temp Source 11/25/23 1736 Oral     SpO2 11/25/23 1736 98 %     Weight 11/25/23 1737 210 lb (95.3 kg)     Height 11/25/23 1737 6' (1.829 m)     Head Circumference --      Peak Flow --      Pain Score 11/25/23 1736 10     Pain Loc --      Pain Education --      Exclude from Growth Chart --     Most recent vital signs: Vitals:   11/25/23 1736  BP: (!) 157/128  Pulse: (!) 119  Resp: 20  Temp: 97.9 F (36.6 C)  SpO2: 98%    General: Awake, no distress.  CV:  Good peripheral perfusion.  Regular rate and rhythm  Resp:  Normal effort.   Abd:  No distention.   Other:  Patient has good range of motion in the shoulder elbow and wrist although states some pain with range of motion in the wrist.  Patient has mild amount of swelling medially in the wrist.  No fever.  No chest pain.  No shortness of breath.     ED Results / Procedures / Treatments   RADIOLOGY  I have reviewed the x-ray images of the wrist I do not appreciate any obvious fracture on my evaluation. Radiology has read the x-ray is negative for fracture dislocation there is some soft tissue swelling adjacent to the distal ulna this does correlate to the area on exam that is mildly swollen.  Patient's humerus x-ray is unremarkable.  Patient's shoulder x-ray is unremarkable.   MEDICATIONS ORDERED IN ED: Medications - No data to display   IMPRESSION / MDM / ASSESSMENT AND PLAN / ED COURSE  I reviewed the triage vital signs and the nursing notes.  Patient's presentation is most consistent with acute illness / injury with system symptoms.  Patient presents to the emergency department for right wrist/arm pain.  Patient does have some mild swelling adjacent to the ulna and the distal ulna/wrist but denies any known injury.  Patient does have a history of DVT and states he  has not been taking his blood thinner as he showed last dose was approximately 4 weeks ago per patient.  We will obtain an ultrasound of the arm to help rule out DVT.  Otherwise x-ray showed no significant finding.  Patient is mildly tachycardic and hypertensive however patient has not been drinking today normally drinks alcohol could be experiencing mild withdrawal as well.  Patient's ultrasound is negative for DVT.  X-ray showed no significant finding besides mild swelling around the wrist.  FINAL CLINICAL IMPRESSION(S) / ED DIAGNOSES   Right arm pain    Note:  This document was prepared using Dragon voice recognition software and may include unintentional dictation errors.   Minna Antis, MD 11/25/23 [phone]

## 2024-05-12 ENCOUNTER — Ambulatory Visit: Payer: Self-pay

## 2024-12-29 ENCOUNTER — Encounter: Payer: Self-pay | Admitting: Family Medicine

## 2024-12-29 ENCOUNTER — Ambulatory Visit: Payer: Self-pay | Admitting: Family Medicine

## 2024-12-29 DIAGNOSIS — Z113 Encounter for screening for infections with a predominantly sexual mode of transmission: Secondary | ICD-10-CM

## 2024-12-29 LAB — GRAM STAIN

## 2024-12-29 NOTE — Progress Notes (Signed)
 " [facility] Department STI clinic [address], [address] [place] [address] Main phone: [phone]  STI screening visit  Subjective:  Glenn Horn is a 44 y.o. male being seen today for an STI screening visit. The patient reports they do have symptoms.    Patient has the following medical conditions:  There are no active problems to display for this patient.  Chief Complaint  Patient presents with   SEXUALLY TRANSMITTED DISEASE    HPI Patient reports to clinic for STI testing- states he has had penile irritation x 1 week - denies discharge or other symptoms  Reproductive considerations: Does the patient or their partner desires a pregnancy in the next year? No  See flowsheet for further details and programmatic requirements  Hyperlink available at the top of the signed note in blue.  Flow sheet content below:  Pregnancy Intention Screening Does the patient want to become pregnant in the next year?: N/A Does the patient's partner want to become pregnant in the next year?: No Would the patient like to discuss contraceptive options today?: N/A Counseling Patient counseled to use condoms with all sex: Condoms declined RTC in 2-3 weeks for test results: Yes Clinic will call if test results abnormal before test result appt.: Yes Immunizations: Referred, Immunization history assessed Test results given to patient Patient counseled to use condoms with all sex: Condoms declined  Screening for MPX risk:  Unexplained rash?  No   MSM?  No   Multiple or anonymous sex partners?  No   Any close or sexual contact with a person  diagnosed with MPX?  No   Any outside the [place]  where MPX is endemic?  No   High clinical suspicion for MPX?    -Unlikely to be chickenpox    -Lymphadenopathy    -Rash that presents in same phase of       evolution on any given body part  No   Does this patient meet CDC recommendations for vaccination against MPOX? No  You  already have or anticipate having the following risks:  Your sex partner has the following risks: You're traveling to a county with a clade I MPOX outbreak and anticipate these risks: Occupational exposure  You had known or suspected exposure to someone with monkeypox You had a sex partner in the past 2 weeks who was diagnosed with monkeypox You are a gay, bisexual, or other man who has sex with men, or are transgender or nonbinary and in the past 6 months have had any of the following: - A new diagnosis of one or more sexually transmitted diseases (e.g., chlamydia, gonorrhea, or syphilis) - More than one sex partner You have had any of the following in the past 6 months: - Sex at a commercial sex venue (like a sex club or bathhouse) - Sex related to a large commercial event   or in a geographic area (city or county for example) where mpox virus transmission is occurring Sex with a new partner Sex at a commercial sex venue (e.g., a sex club or bathhouse) Sex in it consultant for money, goods, drugs, or other trade Sex in association with a large public event (e.g., a rave, party, or festival) i.e. certain people who work in a laboratory or healthcare facility   Infectious disease screenings: Vaccinated against HPV? Unknown  HIV Ever had a positive? No Last test: 2024 Results in chart:  Lab Results  Component Value Date   HMHIVSCREEN Negative - Validated 03/09/2023  No results found for: HIV   Hep B Hep B status: unknown or no prior testing Received HBV vaccination? Unknown Received HBV testing for immunity? Unknown Results in chart:  No components found for: HMHEPBSCREEN  Do they qualify for HBV screening today? Yes and politely declines Criteria:  -Household, sexual or needle sharing contact with HBV -History of drug use or homelessness -HIV positive -Those with known Hep C  Hep C Hep C status: unknown or no prior testing Results in chart:  Lab Results  Component Value Date    HMHEPCSCREEN Negative-Validated 03/09/2023   No components found for: HEPC  Do they qualify for HCV screening today? Yes and but politely declines testing today Criteria - since the last HCV result, does the patient have any of the following? - Current drug use - Have a partner with drug use - Has been incarcerated  Immunization history:   There is no immunization history on file for this patient.  The following portions of the patient's history were reviewed and updated as appropriate: allergies, current medications, past medical history, past social history, past surgical history and problem list.  Substance use screenings:  Uses tobacco products? Yes Uses vapes? No Uses alcohol? Yes Uses non-injectable substances that alter your mental status? Yes Uses non-prescribed injectable substances? No   There is no immunization history on file for this patient.  The following portions of the patient's history were reviewed and updated as appropriate: allergies, current medications, past medical history, past social history, past surgical history and problem list.  Objective:  There were no vitals filed for this visit.  Physical Exam Exam conducted with a chaperone present Brett Orange CNA).  Constitutional:      Appearance: Normal appearance.  HENT:     Head: Normocephalic and atraumatic.     Comments: No nits or hair loss on scalp, brows, or lashes    Mouth/Throat:     Mouth: Mucous membranes are moist. No oral lesions.     Pharynx: Oropharynx is clear. No oropharyngeal exudate or posterior oropharyngeal erythema.  Eyes:     General:        Right eye: No discharge.        Left eye: No discharge.     Conjunctiva/sclera: Conjunctivae normal.     Right eye: Right conjunctiva is not injected. No exudate.    Left eye: Left conjunctiva is not injected. No exudate. Pulmonary:     Effort: Pulmonary effort is normal.  Abdominal:     Palpations: There is no hepatomegaly.      Tenderness: There is no abdominal tenderness. There is no rebound.  Genitourinary:    Pubic Area: No rash or pubic lice (no nits).      Penis: Normal and uncircumcised. No discharge or lesions.      Testes: Normal.     Epididymis:     Right: Normal. No mass or tenderness.     Left: Normal. No mass or tenderness.     Rectum: Tenderness: no lesions or discharge.     Comments: Penile Discharge Amount: none Color:  none Lymphadenopathy:     Cervical: No cervical adenopathy.     Upper Body:     Right upper body: No supraclavicular, axillary or epitrochlear adenopathy.     Left upper body: No supraclavicular, axillary or epitrochlear adenopathy.     Lower Body: No right inguinal adenopathy. No left inguinal adenopathy.  Skin:    General: Skin is warm and dry.  Findings: No lesion or rash.  Neurological:     Mental Status: He is alert and oriented to person, place, and time.      Assessment and Plan:  Glenn Horn is a 44 y.o. male presenting to the [facility] Department for STI screening.  Patient accepted the following screenings: penile gram stain for GC and urine CT/GC  1. Screening for venereal disease (Primary)  - Chlamydia/GC NAA, Confirmation - Gram stain   Counseling: Recommended condom use with all sex Discussed importance of condom use for STI prevention Discussed time line for State Lab results and that patient will be called with positive results and encouraged patient to call if they had not heard in 2 weeks Recommended repeat testing in 3 months with positive results. Recommended returning for continued or worsening symptoms.   Return if symptoms worsen or fail to improve, for STI screening.  No future appointments.  Verneta Bers, [place] "

## 2024-12-30 ENCOUNTER — Ambulatory Visit: Payer: Self-pay | Admitting: Family Medicine

## 2025-01-07 LAB — C. TRACHOMATIS NAA, CONFIRM: C. trachomatis NAA, Confirm: POSITIVE — AB

## 2025-01-07 LAB — CHLAMYDIA/GC NAA, CONFIRMATION
Chlamydia trachomatis, NAA: POSITIVE — AB
Neisseria gonorrhoeae, NAA: POSITIVE — AB

## 2025-01-07 LAB — N. GONORRHOEAE NAA, CONFIRM: N. gonorrhoeae NAA, Confirm: POSITIVE — AB

## 2025-01-07 NOTE — Progress Notes (Signed)
 Attempted to call pt by phone . No answer. [facility] and sent message on MyChart.

## 2025-01-13 NOTE — Progress Notes (Signed)
 Tried pt via phone #2. No answer . Went to [employer]. Will mail letter to pt.

## 2025-01-13 NOTE — Telephone Encounter (Addendum)
 Attempt #2 to contact pt regarding + test results. No answer. Went to [employer]. LMTRC,      Pt attempted to call back,call dropped. Tried to call pt right back went to voicemail.
